# Patient Record
Sex: Female | Born: 1995 | Hispanic: No | Marital: Married | Smoking: Never smoker
Health system: Southern US, Community
[De-identification: ages and names within clinical notes are randomized; demographics above are authoritative.]

## PROBLEM LIST (undated history)

## (undated) DIAGNOSIS — A048 Other specified bacterial intestinal infections: Secondary | ICD-10-CM

## (undated) DIAGNOSIS — K219 Gastro-esophageal reflux disease without esophagitis: Secondary | ICD-10-CM

## (undated) HISTORY — DX: Other specified bacterial intestinal infections: A04.8

## (undated) HISTORY — DX: Gastro-esophageal reflux disease without esophagitis: K21.9

## (undated) HISTORY — PX: NO PAST SURGERIES: SHX2092

---

## 2016-02-20 ENCOUNTER — Encounter (HOSPITAL_COMMUNITY): Payer: Self-pay | Admitting: Emergency Medicine

## 2016-02-20 ENCOUNTER — Ambulatory Visit (HOSPITAL_COMMUNITY)
Admission: EM | Admit: 2016-02-20 | Discharge: 2016-02-20 | Disposition: A | Discharge status: Home / Self Care | Payer: PRIVATE HEALTH INSURANCE | Attending: Internal Medicine | Admitting: Internal Medicine

## 2016-02-20 DIAGNOSIS — R1013 Epigastric pain: Secondary | ICD-10-CM | POA: Diagnosis not present

## 2016-02-20 DIAGNOSIS — R197 Diarrhea, unspecified: Secondary | ICD-10-CM

## 2016-02-20 DIAGNOSIS — R112 Nausea with vomiting, unspecified: Secondary | ICD-10-CM

## 2016-02-20 LAB — POCT PREGNANCY, URINE: Preg Test, Ur: NEGATIVE

## 2016-02-20 LAB — POCT URINALYSIS DIP (DEVICE)
GLUCOSE, UA: NEGATIVE mg/dL
LEUKOCYTES UA: NEGATIVE
Nitrite: NEGATIVE
Protein, ur: >=300 mg/dL — AB
UROBILINOGEN UA: 0.2 mg/dL (ref 0.0–1.0)
pH: 6 (ref 5.0–8.0)

## 2016-02-20 MED ORDER — ONDANSETRON HCL 4 MG PO TABS: 4.0000 mg | ORAL_TABLET | Freq: Four times a day (QID) | ORAL | 0 refills | Status: DC

## 2016-02-20 NOTE — ED Provider Notes (Signed)
CSN: [id number]     Arrival date & time 02/20/16  1322 History   First MD Initiated Contact with Patient 02/20/16 1418     Chief Complaint  Patient presents with  . Emesis  . Diarrhea   (Consider location/radiation/quality/duration/timing/severity/associated sxs/prior Treatment) HPI [facility] Margit Bandalnajrani is a 20 y.o. female presenting to UC with c/o n/v/d and epigastric pain that started yesterday.  She reports 2-3 episodes of vomiting since yesterday and 3-4 episodes of watery diarrhea w/o blood or mucous in stool.  She did have some OTC pain reliever for her menstrual cramps, LMP 02/17/16, but nothing for her nausea or diarrhea.  Denies fever, chills, or urinary symptoms. No sick contacts or recent travel. Pt requesting school note as she missed yesterday and today due to feeling so bad. Denies hx of abdominal surgeries. She does take Nexium occasionally for acid reflux but has not taken today.    History reviewed. No pertinent past medical history. History reviewed. No pertinent surgical history. No family history on file. Social History  Substance Use Topics  . Smoking status: Never Smoker  . Smokeless tobacco: Never Used  . Alcohol use No   OB History    No data available     Review of Systems  Constitutional: Positive for fatigue. Negative for chills and fever.  Gastrointestinal: Positive for abdominal pain ( epigastric), diarrhea, nausea and vomiting. Negative for blood in stool and constipation.  Genitourinary: Negative for dysuria, flank pain, frequency and hematuria.  Musculoskeletal: Negative for back pain and myalgias.  Skin: Negative for color change and rash.  Neurological: Negative for syncope, weakness and numbness.    Allergies  Review of patient's allergies indicates no known allergies.  Home Medications   Prior to Admission medications   Medication Sig Start Date End Date Taking? Authorizing Provider  esomeprazole (NEXIUM) 20 MG capsule Take 20 mg  by mouth daily at 12 noon.   Yes Historical Provider, MD  ondansetron (ZOFRAN) 4 MG tablet Take 1 tablet (4 mg total) by mouth every 6 (six) hours. 02/20/16   Junius FinnerErin O'Malley, PA-C   Meds Ordered and Administered this Visit  Medications - No data to display  BP 137/80 (BP Location: Left Arm)   Pulse 104   Temp 98.3 F (36.8 C) (Oral)   Resp 18   LMP 02/17/2016   SpO2 96%  No data found.   Physical Exam  Constitutional: She appears well-developed and well-nourished. No distress.  Pt sitting on exam bed, NAD.  HENT:  Head: Normocephalic and atraumatic.  Mouth/Throat: Oropharynx is clear and moist.  Eyes: Conjunctivae are normal. No scleral icterus.  Neck: Normal range of motion. Neck supple.  Cardiovascular: Normal rate, regular rhythm and normal heart sounds.   Pulmonary/Chest: Effort normal and breath sounds normal. No respiratory distress. She has no wheezes. She has no rales.  Abdominal: Soft. She exhibits no distension and no mass. Bowel sounds are increased. There is tenderness in the epigastric area. There is no rebound and no guarding.  Musculoskeletal: Normal range of motion.  Neurological: She is alert.  Skin: Skin is warm and dry. She is not diaphoretic.  Nursing note and vitals reviewed.   Urgent Care Course   Clinical Course    Procedures (including critical care time)  Labs Review Labs Reviewed  POCT URINALYSIS DIP (DEVICE) - Abnormal; Notable for the following:       Result Value   Bilirubin Urine SMALL (*)    Ketones, ur TRACE (*)  Hgb urine dipstick LARGE (*)    Protein, ur >=300 (*)    All other components within normal limits  POCT PREGNANCY, URINE    Imaging Review No results found.   MDM   1. Nausea vomiting and diarrhea   2. Epigastric pain    Pt c/o epigastric pain with associated n/v/d. Pt appears well, non-toxic. NAD. Moist mucous membranes. Vitals: WNL  Epigastric tenderness w/o rebound or guarding. No abdominal distension. UA:  no evidence of UTI, hematuria likely from pt being on menstrual cycle. Pt denied urinary symptoms.  GI symptoms likely viral in nature. Rx: Zofran Encouraged fluids, rest, slowly introduce B.R.A.T diet and then normal diet when feeling better. F/u with PCP or return to UC in 2-3 days if not improving. Patient verbalized understanding and agreement with treatment plan.     Junius Finner, PA-C 02/20/16 1444

## 2016-02-20 NOTE — ED Triage Notes (Signed)
C/o vomiting and diarrhea onset yest associated w/nausea and epigastric pain  Hx of colon problems and states she takes Nexium for it.   LMP = 10/24  LBM = yest (normal)  Denies fevers, urinary sx  A&O x4... NAD

## 2016-03-26 ENCOUNTER — Encounter (INDEPENDENT_AMBULATORY_CARE_PROVIDER_SITE_OTHER): Payer: Self-pay

## 2016-03-26 ENCOUNTER — Ambulatory Visit (INDEPENDENT_AMBULATORY_CARE_PROVIDER_SITE_OTHER): Payer: PPO | Admitting: Internal Medicine

## 2016-03-26 ENCOUNTER — Encounter: Payer: Self-pay | Admitting: Internal Medicine

## 2016-03-26 VITALS — BP 125/71 | HR 90 | Temp 97.8°F | Ht 62.0 in | Wt 104.6 lb

## 2016-03-26 DIAGNOSIS — Z23 Encounter for immunization: Secondary | ICD-10-CM | POA: Diagnosis not present

## 2016-03-26 DIAGNOSIS — Z681 Body mass index (BMI) 19 or less, adult: Secondary | ICD-10-CM

## 2016-03-26 DIAGNOSIS — R1013 Epigastric pain: Secondary | ICD-10-CM | POA: Insufficient documentation

## 2016-03-26 DIAGNOSIS — K219 Gastro-esophageal reflux disease without esophagitis: Secondary | ICD-10-CM

## 2016-03-26 MED ORDER — PANTOPRAZOLE SODIUM 40 MG PO TBEC: 40.0000 mg | DELAYED_RELEASE_TABLET | Freq: Two times a day (BID) | ORAL | 1 refills | Status: DC

## 2016-03-26 NOTE — Patient Instructions (Addendum)
Please take Protonix 40 mg twice daily for one month. Also try and avoid foods and drinks that cause reflux.  We have provided a H. Pylori sample tube that you should collect and send to our lab for analysis.   Please return in 3-4 weeks to check on your symptoms - if they are no better at that time we will refer you to see the gastroenterology doctors.

## 2016-03-26 NOTE — Progress Notes (Addendum)
   CC: Reflux symptoms and abdominal pain  HPI:  Cassandra Stone is a [place]-American 20 y.o. female with PMHx detailed below presenting with progressively frequent episodes of post-prandial epigastric pain and reflux symptoms.   See problem based assessment and plan below for additional details.  Past Medical History:  Diagnosis Date  . GERD (gastroesophageal reflux disease)   . H. pylori infection    2014, treated   Social History: Nonsmoker, does not drink, no drug use. 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  Family history: Noncontributory  Review of Systems: Review of Systems  Constitutional: Negative for chills, fever, malaise/fatigue and weight loss.  Respiratory: Positive for shortness of breath (with her reflux when supine). Negative for cough, sputum production and wheezing.   Cardiovascular: Negative for chest pain and palpitations.  Gastrointestinal: Positive for abdominal pain and heartburn. Negative for blood in stool, constipation, diarrhea, melena, nausea and vomiting.  Psychiatric/Behavioral: Negative for depression and substance abuse. The patient is not nervous/anxious.   All other systems reviewed and are negative.   Physical Exam: Vitals:   03/26/16 1543  BP: 125/71  Pulse: 90  Temp: 97.8 F (36.6 C)  TempSrc: Oral  SpO2: 100%  Weight: 104 lb 9.6 oz (47.4 kg)  Height: 5\' 2"  (1.575 m)   Body mass index is 19.13 kg/m. GENERAL- Thin woman sitting comfortably in exam room chair, alert, in no distress, conversational HEENT- Atraumatic, EOMI, moist mucous membranes CARDIAC- Regular rate and rhythm, no murmurs, rubs or gallops. RESP- Clear to ascultation bilaterally, no wheezing or crackles, normal work of breathing ABDOMEN- Soft, mild epigastric tenderness to palpation, nondistended EXTREMITIES- Normal bulk and range of motion, no edema, 2+ peripheral pulses SKIN- Warm, dry, intact PSYCH-  Appropriate affect, clear speech, thoughts linear and goal-directed  Assessment & Plan:   See encounters tab for problem based medical decision making.  Patient seen with Dr. Oswaldo DoneVincent

## 2016-03-26 NOTE — Assessment & Plan Note (Signed)
Reports history of reflux and gastritis for years, underwent evaluation by physicians when living in [place] in 2013. History of H. Pylori infection at that time, underwent treatment, and had a negative stool antigen test afterward. [place]. Her symptoms have returned over the past few weeks/months with intermittent epigastric pain after eating meals, along with vertical burning sensation deep in her chest with burping/tasting her food. She reports these symptoms are worse when supine and cause significant discomfort at night when she lays down to sleep, sometimes with sensation of epigastric pressure and subjective shortness of breath. This discomfort is more-persistently triggered by certain foods and drinks, such as coffee. She has tried taking esomeprazole (20mg ) PRN for these symptoms with diminishing effectiveness. She denies weight loss, early satiety, N/V, constipation, diarrhea, cough.  Plan: - Trial Protonix 40mg  BID scheduled for 1 month - Check H pylori stool antigen  - If symptoms persist despite maximum medical management will refer to GI

## 2016-03-26 NOTE — Assessment & Plan Note (Signed)
Received flu vaccine today. Assess full vaccination history at future visit.

## 2016-03-29 ENCOUNTER — Encounter: Payer: Self-pay | Admitting: Internal Medicine

## 2016-03-30 NOTE — Progress Notes (Signed)
Internal Medicine Clinic Attending  I saw and evaluated the patient.  I personally confirmed the key portions of the history and exam documented by Dr. Johnson and I reviewed pertinent patient test results.  The assessment, diagnosis, and plan were formulated together and I agree with the documentation in the resident's note.  

## 2016-03-31 ENCOUNTER — Encounter (INDEPENDENT_AMBULATORY_CARE_PROVIDER_SITE_OTHER): Payer: Self-pay

## 2016-03-31 ENCOUNTER — Other Ambulatory Visit: Payer: PRIVATE HEALTH INSURANCE

## 2016-04-02 LAB — H. PYLORI ANTIGEN, STOOL: H pylori Ag, Stl: NEGATIVE

## 2016-05-06 ENCOUNTER — Telehealth: Payer: Self-pay | Admitting: Internal Medicine

## 2016-05-06 NOTE — Telephone Encounter (Signed)
APT. REMINDER CALL, PHONE NOT IN SERVICE °

## 2016-05-07 ENCOUNTER — Encounter: Payer: PRIVATE HEALTH INSURANCE | Admitting: Internal Medicine

## 2016-06-02 ENCOUNTER — Other Ambulatory Visit: Payer: Self-pay | Admitting: Internal Medicine

## 2016-06-02 DIAGNOSIS — K219 Gastro-esophageal reflux disease without esophagitis: Secondary | ICD-10-CM

## 2016-06-04 ENCOUNTER — Encounter (INDEPENDENT_AMBULATORY_CARE_PROVIDER_SITE_OTHER): Payer: Self-pay

## 2016-06-04 ENCOUNTER — Encounter: Payer: Self-pay | Admitting: Internal Medicine

## 2016-06-04 ENCOUNTER — Ambulatory Visit (INDEPENDENT_AMBULATORY_CARE_PROVIDER_SITE_OTHER): Payer: PPO | Admitting: Internal Medicine

## 2016-06-04 VITALS — BP 132/68 | HR 98 | Temp 98.0°F | Ht 62.0 in | Wt 113.5 lb

## 2016-06-04 DIAGNOSIS — Z8719 Personal history of other diseases of the digestive system: Secondary | ICD-10-CM | POA: Diagnosis not present

## 2016-06-04 DIAGNOSIS — N941 Unspecified dyspareunia: Secondary | ICD-10-CM | POA: Insufficient documentation

## 2016-06-04 DIAGNOSIS — K219 Gastro-esophageal reflux disease without esophagitis: Secondary | ICD-10-CM | POA: Diagnosis not present

## 2016-06-04 DIAGNOSIS — R1013 Epigastric pain: Secondary | ICD-10-CM

## 2016-06-04 LAB — POCT URINE PREGNANCY: Preg Test, Ur: NEGATIVE

## 2016-06-04 NOTE — Progress Notes (Signed)
   CC: Follow up of GERD and abdominal pain  HPI:  [name] is a 21 y.o. female with PMHx detailed below presenting for follow up of her GERD and persistent abdominal pain. She reports that the Protonix BID helped somewhat but she continues to experience intermittent epigastric discomfort and subjective fullness 3-4x daily. Belching seems to relieve this pain and subjective fullness. She continues to experience persistent reflux symptoms at night when supine, often with belching and taste of food or acid in her mouth. She denies orthopnea, dyspnea, or cough with these symptoms. She reports her symptoms are worse with certain foods (coffee, spicy foods, eggs, etc) and especially after large or late meals. She reports only being able to sleep 3-4 hours a night but attributes this to her demanding work and study schedule.  See problem based assessment and plan below for additional details.  Past Medical History:  Diagnosis Date  . GERD (gastroesophageal reflux disease)   . H. pylori infection    2014, treated    Review of Systems: Review of Systems  Constitutional: Negative for chills, fever and weight loss.  Respiratory: Negative for cough and shortness of breath.   Cardiovascular: Negative for chest pain, palpitations, orthopnea and claudication.  Gastrointestinal: Positive for abdominal pain and heartburn. Negative for blood in stool, constipation, diarrhea, melena, nausea and vomiting.  Genitourinary: Negative for dysuria.  Psychiatric/Behavioral: Negative for depression. The patient has insomnia. The patient is not nervous/anxious.   All other systems reviewed and are negative.    Physical Exam: Vitals:   06/04/16 1548 06/04/16 1630  BP: (!) 145/71 132/68  Pulse: 98   Temp: 98 F (36.7 C)   TempSrc: Oral   SpO2: 100%   Weight: 113 lb 8 oz (51.5 kg)   Height: 5\' 2"  (1.575 m)    Body mass index is 20.76 kg/m. GENERAL- Thin young woman in traditional  clothing sitting comfortably in exam room chair, alert, in no distress HEENT- Atraumatic, PERRL, moist mucous membranes CARDIAC- Regular rate and rhythm, no murmurs, rubs or gallops. RESP- Clear to ascultation bilaterally, no wheezing or crackles, normal work of breathing ABDOMEN- Normoactive bowel sounds, soft, epigastrum mildly tender to palpation, nondistended EXTREMITIES- Thin bulk and range of motion, no edema, 2+ peripheral pulses SKIN- Warm, dry, intact, without visible rash PSYCH- Appropriate affect, clear speech, thoughts linear and goal-directed  Assessment & Plan:   See encounters tab for problem based medical decision making.  Patient discussed with Dr. Rogelia BogaButcher

## 2016-06-04 NOTE — Patient Instructions (Addendum)
Please continue to take the Protonix medication twice daily.   We have placed an order for a gastric emptying study to assess your stomach, this should be scheduled within the next week.  Please keep a diary of foods/drinks and symptoms you experience after meals to find patterns and food you need to avoid.  We will see you in 2 months to reassess your symptoms. If you feel that they are worse before then you can call and move your appointment sooner.

## 2016-06-06 NOTE — Assessment & Plan Note (Signed)
Continues to experience persistent symptoms despite Protonix 40 mg BID for the past 2 months, although this medication has helped some. H. Pylori was negative. Episodes of epigastric pain and left sided fullness relieve by belching occur 3-4x daily. Persistent positional GERD symptoms at night, classic food triggers. Reports certain foods like yogurt seem to help. Symptoms causing moderate distress and impairing her ability to sleep as well.   Plan: - Continue Protonix 40 mg BID - Obtain NM gastric emptying study - Asked to create a food diary to find trigger foods/drinks or symptom patterns - Provided information on GERD and IBS-friendly diets and common trigger foods - If no improvement in 2 months may require referral to GI

## 2016-06-06 NOTE — Assessment & Plan Note (Addendum)
Requesting referral to gynecologist. Is concerned about dyspareunia. She initially states that this only happens at certain times of the month and that otherwise she has no pain with intercourse but later states it happens most of the time. She denies vaginal discharge, bleeding, or pelvic pain. She is sexually active and 1 female partner/husband and does not use OCPs. She is due for her pap smear. Likely endometriosis but warrants further investigation.  Plan: - ObtainPOC urine pregnancy test as we are obtaining NM gastric study in near future - negative - Provided gynecology referral

## 2016-06-11 NOTE — Progress Notes (Signed)
Internal Medicine Clinic Attending  Case discussed with Dr. Johnson at the time of the visit.  We reviewed the resident's history and exam and pertinent patient test results.  I agree with the assessment, diagnosis, and plan of care documented in the resident's note.  

## 2016-06-16 ENCOUNTER — Encounter (HOSPITAL_COMMUNITY)
Admission: RE | Admit: 2016-06-16 | Discharge: 2016-06-16 | Disposition: A | Discharge status: Home / Self Care | Payer: 59 | Source: Ambulatory Visit | Attending: Internal Medicine | Admitting: Internal Medicine

## 2016-06-16 DIAGNOSIS — K219 Gastro-esophageal reflux disease without esophagitis: Secondary | ICD-10-CM | POA: Diagnosis not present

## 2016-06-16 DIAGNOSIS — R1013 Epigastric pain: Secondary | ICD-10-CM | POA: Insufficient documentation

## 2016-06-16 MED ORDER — TECHNETIUM TC 99M SULFUR COLLOID
2.0000 | Freq: Once | INTRAVENOUS | Status: AC | PRN
  Administered 2016-06-16: 2 via INTRAVENOUS

## 2016-07-08 ENCOUNTER — Encounter: Payer: Self-pay | Admitting: Internal Medicine

## 2016-07-14 ENCOUNTER — Encounter: Payer: Self-pay | Admitting: Obstetrics & Gynecology

## 2016-07-21 ENCOUNTER — Other Ambulatory Visit: Payer: Self-pay | Admitting: Internal Medicine

## 2016-07-21 DIAGNOSIS — K219 Gastro-esophageal reflux disease without esophagitis: Secondary | ICD-10-CM

## 2016-08-09 ENCOUNTER — Encounter: Payer: 59 | Admitting: Obstetrics & Gynecology

## 2016-08-13 ENCOUNTER — Encounter: Payer: PRIVATE HEALTH INSURANCE | Admitting: Internal Medicine

## 2016-08-18 ENCOUNTER — Other Ambulatory Visit: Payer: Self-pay | Admitting: Internal Medicine

## 2016-08-18 DIAGNOSIS — K219 Gastro-esophageal reflux disease without esophagitis: Secondary | ICD-10-CM

## 2016-09-10 NOTE — Addendum Note (Signed)
Addended by: Neomia DearPOWERS, Tascha Casares E on: 09/10/2016 06:37 AM   Modules accepted: Orders

## 2016-10-04 ENCOUNTER — Encounter: Payer: Self-pay | Admitting: *Deleted

## 2017-06-10 ENCOUNTER — Encounter: Payer: Self-pay | Admitting: Internal Medicine

## 2017-08-14 ENCOUNTER — Inpatient Hospital Stay (HOSPITAL_COMMUNITY)
Admission: AD | Admit: 2017-08-14 | Discharge: 2017-08-14 | Disposition: A | Discharge status: Home / Self Care | Payer: PPO | Source: Ambulatory Visit | Attending: Obstetrics and Gynecology | Admitting: Obstetrics and Gynecology

## 2017-08-14 DIAGNOSIS — N939 Abnormal uterine and vaginal bleeding, unspecified: Secondary | ICD-10-CM | POA: Insufficient documentation

## 2017-08-14 DIAGNOSIS — O039 Complete or unspecified spontaneous abortion without complication: Secondary | ICD-10-CM

## 2017-08-14 DIAGNOSIS — O0281 Inappropriate change in quantitative human chorionic gonadotropin (hCG) in early pregnancy: Secondary | ICD-10-CM

## 2017-08-14 LAB — URINALYSIS, ROUTINE W REFLEX MICROSCOPIC
BACTERIA UA: NONE SEEN
BILIRUBIN URINE: NEGATIVE
Glucose, UA: NEGATIVE mg/dL
KETONES UR: 5 mg/dL — AB
LEUKOCYTES UA: NEGATIVE
Nitrite: NEGATIVE
PROTEIN: NEGATIVE mg/dL
Specific Gravity, Urine: 1.017 (ref 1.005–1.030)
pH: 5 (ref 5.0–8.0)

## 2017-08-14 LAB — ABO/RH: ABO/RH(D): O POS

## 2017-08-14 LAB — HCG, QUANTITATIVE, PREGNANCY: HCG, BETA CHAIN, QUANT, S: 3 m[IU]/mL (ref <5)

## 2017-08-14 LAB — POCT PREGNANCY, URINE: Preg Test, Ur: NEGATIVE

## 2017-08-14 NOTE — MAU Note (Signed)
History     Chief Complaint  Patient presents with  . Vaginal Bleeding   22 yo G1P0 married female presents with c/o vaginal bleeding that started today. Pt reports having had a urine preg test that was neg and blood work that was positive couple days ago. Records reviewed and pt was seen at [facility] on 4/17 where a complete exam was done and UPT was neg and HCG was 24. Pt was not bleeding at the time and her cycle is late  OB History   None     Past Medical History:  Diagnosis Date  . GERD (gastroesophageal reflux disease)   . H. pylori infection    2014, treated    No past surgical history on file.  No family history on file.  Social History   Tobacco Use  . Smoking status: Never Smoker  . Smokeless tobacco: Never Used  Substance Use Topics  . Alcohol use: No  . Drug use: No    Allergies: No Known Allergies  Medications Prior to Admission  Medication Sig Dispense Refill Last Dose  . pantoprazole (PROTONIX) 40 MG tablet TAKE 1 TABLET BY MOUTH TWICE A DAY 60 tablet 0      Physical Exam   Blood pressure 136/83, pulse (!) 116, temperature 98.7 F (37.1 C), temperature source Oral, resp. rate 18, height 5\' 2"  (1.575 m), weight 57.5 kg (126 lb 12 oz).  No exam performed today, not indicated. ED Course  IMP: vaginal bleeding most likely chemical pregnancy P)  Urine preg test was neg. repeat HCG. Check blood type MDM  addendum " hcg=3 c/w chemical pregnancy. Advised couple had they not done original blood work then she would have assumed that this is her cycle. Therefore this is considered a chemical pregnancy. Please f/u at office with Dr Ernestina PennaFogleman Serita KyleSheronette A Tarae Wooden, MD 6:23 PM 08/14/2017

## 2017-08-14 NOTE — MAU Note (Signed)
Bleeding since this morning. Some brown mucus discharge before that and 1 small clot.  [facility] and quant of 24.

## 2017-12-05 IMAGING — NM NM GASTRIC EMPTYING
4 series · 4 of 4 positions shown · non-contrast
Comparison: None.

CLINICAL DATA: Postprandial pain and reflux

EXAM:
NUCLEAR MEDICINE GASTRIC EMPTYING SCAN
TECHNIQUE: After oral ingestion of radiolabeled meal, sequential abdominal
images were obtained for 4 hours. Percentage of activity emptying
the stomach was calculated at 1 hour, 2 hour, 3 hour, and 4 hours.
RADIOPHARMACEUTICALS:  2.0 mCi Uc-NNm sulfur colloid in standardized
meal

[ge gastric empty · 2.51mm/px · 1 of 1 slices shown (1 of 4)]
[im 1/1]
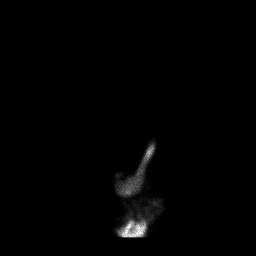

[ge gastric empty · 2.51mm/px · 1 of 1 slices shown (2 of 4)]
[im 1/1]
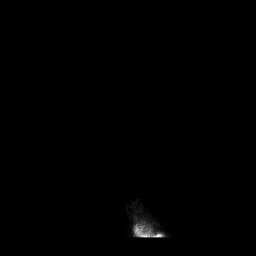

[ge gastric empty · 2.51mm/px · 1 of 1 slices shown (3 of 4)]
[im 1/1]
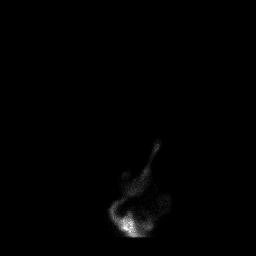

[ge gastric empty · 2.51mm/px · 1 of 1 slices shown (4 of 4)]
[im 1/1]
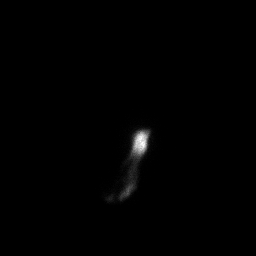

[4 of 4 positions shown; findings below may reference images not displayed]

FINDINGS: Expected location of the stomach in the left upper quadrant.
Ingested meal empties the stomach gradually over the course of the
study.

Sixty emptied at 1 hr ( normal >= 10%)

Eighty-two emptied at 2 hr ( normal >= 40%)

Ninety-seven emptied at 3 hr ( normal >= 70%)

The exam ended at 3 hours
IMPRESSION: Normal gastric emptying study.

## 2018-01-24 LAB — OB RESULTS CONSOLE HIV ANTIBODY (ROUTINE TESTING): HIV: NONREACTIVE

## 2018-01-24 LAB — OB RESULTS CONSOLE GC/CHLAMYDIA
Chlamydia: NEGATIVE
Gonorrhea: NEGATIVE

## 2018-01-24 LAB — OB RESULTS CONSOLE ABO/RH: RH Type: POSITIVE

## 2018-01-24 LAB — OB RESULTS CONSOLE RUBELLA ANTIBODY, IGM: Rubella: IMMUNE

## 2018-01-24 LAB — OB RESULTS CONSOLE RPR: RPR: NONREACTIVE

## 2018-01-24 LAB — OB RESULTS CONSOLE ANTIBODY SCREEN: Antibody Screen: NEGATIVE

## 2018-01-24 LAB — OB RESULTS CONSOLE HEPATITIS B SURFACE ANTIGEN: Hepatitis B Surface Ag: NEGATIVE

## 2018-02-11 LAB — OB RESULTS CONSOLE ABO/RH

## 2018-04-26 NOTE — L&D Delivery Note (Signed)
Operative Delivery Note Pushing for 2.1/2 hrs since 12.30 am. OP position. Tight introitus with vaginal bruising and bleeding. Thick perineal body and labial and perineal swelling. Station +3/4 with no further progress.  Vacuum assisted delivery recommended with episiotomy.  Verbal consent: obtained from patient.  Risks and benefits discussed in detail.  Risks include, but are not limited to the risks of anesthesia, bleeding, infection, damage to maternal tissues, fetal cephalhematoma.  There is also the risk of inability to effect vaginal delivery of the head, or shoulder dystocia that cannot be resolved by established maneuvers, leading to the need for emergency cesarean section.  At 3:06 AM a viable and healthy female was delivered via Vaginal, Vacuum Investment banker, operational).  Presentation: vertex; Position: Occiput,, Posterior; Station: +4. Kiwi used. Over one contraction pt pushed 4 times and delivery was achieved.   APGAR: 8, 10; weight  pending.   Placenta status: spontaneous and complete.   Cord:  with the following complications: None.  Cord pH: N/A  Anesthesia: Epidural and local 1% plain lidocaine Instruments: Kiwi Episiotomy: Left Mediolateral Lacerations: 2nd degree (episiotomy without extension) and bilateral vaginal sulcus tears  Suture Repair: 3.0 vicryl rapide Est. Blood Loss (mL):  400 cc  Mom to postpartum.  Baby to Couplet care / Skin to Skin.  Robley Fries 08/14/2018, 3:42 AM

## 2018-06-20 ENCOUNTER — Encounter: Payer: Self-pay | Admitting: Internal Medicine

## 2018-07-12 LAB — OB RESULTS CONSOLE GBS: GBS: NEGATIVE

## 2018-07-21 ENCOUNTER — Ambulatory Visit (HOSPITAL_COMMUNITY)
Admission: EM | Admit: 2018-07-21 | Discharge: 2018-07-21 | Disposition: A | Discharge status: Home / Self Care | Payer: PPO

## 2018-07-21 ENCOUNTER — Other Ambulatory Visit: Payer: Self-pay

## 2018-07-21 ENCOUNTER — Encounter (HOSPITAL_COMMUNITY): Payer: Self-pay | Admitting: Emergency Medicine

## 2018-07-21 DIAGNOSIS — S90851A Superficial foreign body, right foot, initial encounter: Secondary | ICD-10-CM

## 2018-07-21 NOTE — ED Provider Notes (Signed)
[facility]   [id number] 07/21/18 Arrival Time: 1655  CC: FB in heel  SUBJECTIVE:  Cassandra Stone is a 23 y.o. female pt currently pregnant, who presents with a FB in bottom of right foot.  Symptoms began after she stepped on a object earlier today.  Localizes the the pain to the RT heel of foot.  Describes it as painful and red.  Has tried squeezing it out without relief.  Symptoms are made worse with walking.  Denies fever, chills, erythema, swelling, discharge, SOB, chest pain, abdominal pain, changes in bowel or bladder function.    Up-to-date on tetanus  ROS: As per HPI.  Past Medical History:  Diagnosis Date  . GERD (gastroesophageal reflux disease)   . H. pylori infection    2014, treated   No past surgical history on file. No Known Allergies No current facility-administered medications on file prior to encounter.    Current Outpatient Medications on File Prior to Encounter  Medication Sig Dispense Refill  . pantoprazole (PROTONIX) 40 MG tablet TAKE 1 TABLET BY MOUTH TWICE A DAY 60 tablet 0   Social History   Socioeconomic History  . Marital status: Married    Spouse name: Not on file  . Number of children: Not on file  . Years of education: Not on file  . Highest education level: Not on file  Occupational History  . Not on file  Social Needs  . Financial resource strain: Not on file  . Food insecurity:    Worry: Not on file    Inability: Not on file  . Transportation needs:    Medical: Not on file    Non-medical: Not on file  Tobacco Use  . Smoking status: Never Smoker  . Smokeless tobacco: Never Used  Substance and Sexual Activity  . Alcohol use: No  . Drug use: No  . Sexual activity: Yes    Partners: Male    Birth control/protection: None  Lifestyle  . Physical activity:    Days per week: Not on file    Minutes per session: Not on file  . Stress: Not on file  Relationships  . Social connections:    Talks on phone: Not on  file    Gets together: Not on file    Attends religious service: Not on file    Active member of club or organization: Not on file    Attends meetings of clubs or organizations: Not on file    Relationship status: Not on file  . Intimate partner violence:    Fear of current or ex partner: Not on file    Emotionally abused: Not on file    Physically abused: Not on file    Forced sexual activity: Not on file  Other Topics Concern  . Not on file  Social History Narrative  . Not on file   No family history on file.  OBJECTIVE: Vitals:   07/21/18 1714  BP: (!) 142/90  Pulse: (!) 101  Resp: 16  Temp: 98.1 F (36.7 C)  TempSrc: Oral  SpO2: 100%    General appearance: alert; no distress Head: NCAT Lungs: normal respiratory effort Extremities: no edema Skin: warm and dry; black pinpoint object to RT heel, TTP without obvious drainage or bleeding  Psychological: alert and cooperative; normal mood and affect  PROCEDURE:  Verbal consent obtained. Area over RT heel cleansed with alchol swab.  #11 blade scalpel and 18 gauge needle were used to deroof skin over FB  on RT heel.  Unable to remove FB due to patient discomfort. Minimal bleeding. No complications.  ASSESSMENT & PLAN:  1. Foreign body in heel, right, initial encounter     Unable to remove foreign body from heel Soak foot in warm water 3-4 times daily Wash site daily with warm water and mild soap Keep covered to avoid friction Follow up here or with PCP if symptoms persists Call or go to the ED if you have any new or worsening symptoms fever, chills, nausea, vomiting, increased redness, swelling, etc..  Reviewed expectations re: course of current medical issues. Questions answered. Outlined signs and symptoms indicating need for more acute intervention. Patient verbalized understanding. After Visit Summary given.   Rennis Harding, PA-C 07/21/18 1906

## 2018-07-21 NOTE — Discharge Instructions (Signed)
Unable to remove foreign body from heel Soak foot in warm water 3-4 times daily Wash site daily with warm water and mild soap Keep covered to avoid friction Follow up here or with PCP if symptoms persists Call or go to the ED if you have any new or worsening symptoms fever, chills, nausea, vomiting, increased redness, swelling, etc..

## 2018-07-21 NOTE — ED Triage Notes (Signed)
Per pt she is having foot pain due to a small object in the heel of her right foot.

## 2018-08-13 ENCOUNTER — Inpatient Hospital Stay (HOSPITAL_COMMUNITY)
Admission: AD | Admit: 2018-08-13 | Discharge: 2018-08-16 | DRG: 806 | Disposition: A | Discharge status: Home / Self Care | Payer: PPO | Source: Intra-hospital | Attending: Obstetrics & Gynecology | Admitting: Obstetrics & Gynecology

## 2018-08-13 ENCOUNTER — Other Ambulatory Visit: Payer: Self-pay

## 2018-08-13 ENCOUNTER — Inpatient Hospital Stay (HOSPITAL_COMMUNITY): Payer: PPO | Admitting: Anesthesiology

## 2018-08-13 ENCOUNTER — Encounter (HOSPITAL_COMMUNITY): Payer: Self-pay | Admitting: *Deleted

## 2018-08-13 DIAGNOSIS — O9081 Anemia of the puerperium: Secondary | ICD-10-CM | POA: Diagnosis not present

## 2018-08-13 DIAGNOSIS — S91341D Puncture wound with foreign body, right foot, subsequent encounter: Secondary | ICD-10-CM

## 2018-08-13 DIAGNOSIS — Z3A40 40 weeks gestation of pregnancy: Secondary | ICD-10-CM | POA: Diagnosis not present

## 2018-08-13 DIAGNOSIS — M795 Residual foreign body in soft tissue: Secondary | ICD-10-CM

## 2018-08-13 DIAGNOSIS — R030 Elevated blood-pressure reading, without diagnosis of hypertension: Secondary | ICD-10-CM | POA: Diagnosis present

## 2018-08-13 DIAGNOSIS — S99921S Unspecified injury of right foot, sequela: Secondary | ICD-10-CM

## 2018-08-13 DIAGNOSIS — L923 Foreign body granuloma of the skin and subcutaneous tissue: Secondary | ICD-10-CM | POA: Diagnosis not present

## 2018-08-13 DIAGNOSIS — O26893 Other specified pregnancy related conditions, third trimester: Principal | ICD-10-CM | POA: Diagnosis present

## 2018-08-13 DIAGNOSIS — Z3490 Encounter for supervision of normal pregnancy, unspecified, unspecified trimester: Secondary | ICD-10-CM

## 2018-08-13 DIAGNOSIS — O9989 Other specified diseases and conditions complicating pregnancy, childbirth and the puerperium: Secondary | ICD-10-CM | POA: Diagnosis present

## 2018-08-13 DIAGNOSIS — D62 Acute posthemorrhagic anemia: Secondary | ICD-10-CM | POA: Diagnosis not present

## 2018-08-13 DIAGNOSIS — O9902 Anemia complicating childbirth: Secondary | ICD-10-CM | POA: Diagnosis present

## 2018-08-13 LAB — COMPREHENSIVE METABOLIC PANEL
ALT: 13 U/L (ref 0–44)
AST: 23 U/L (ref 15–41)
Albumin: 3.1 g/dL — ABNORMAL LOW (ref 3.5–5.0)
Alkaline Phosphatase: 178 U/L — ABNORMAL HIGH (ref 38–126)
Anion gap: 11 (ref 5–15)
BUN: 7 mg/dL (ref 6–20)
CO2: 20 mmol/L — ABNORMAL LOW (ref 22–32)
Calcium: 9 mg/dL (ref 8.9–10.3)
Chloride: 105 mmol/L (ref 98–111)
Creatinine, Ser: 0.49 mg/dL (ref 0.44–1.00)
GFR calc Af Amer: >60 mL/min (ref >60)
GFR calc non Af Amer: >60 mL/min (ref >60)
Glucose, Bld: 85 mg/dL (ref 70–99)
Potassium: 4.1 mmol/L (ref 3.5–5.1)
Sodium: 136 mmol/L (ref 135–145)
Total Bilirubin: 0.6 mg/dL (ref 0.3–1.2)
Total Protein: 6.4 g/dL — ABNORMAL LOW (ref 6.5–8.1)

## 2018-08-13 LAB — URIC ACID: Uric Acid, Serum: 4.1 mg/dL (ref 2.5–7.1)

## 2018-08-13 LAB — CBC
HCT: 36.5 % (ref 36.0–46.0)
Hemoglobin: 11.4 g/dL — ABNORMAL LOW (ref 12.0–15.0)
MCH: 24.5 pg — ABNORMAL LOW (ref 26.0–34.0)
MCHC: 31.2 g/dL (ref 30.0–36.0)
MCV: 78.5 fL — ABNORMAL LOW (ref 80.0–100.0)
Platelets: 318 10*3/uL (ref 150–400)
RBC: 4.65 MIL/uL (ref 3.87–5.11)
RDW: 14.9 % (ref 11.5–15.5)
WBC: 10.8 10*3/uL — ABNORMAL HIGH (ref 4.0–10.5)
nRBC: 0 % (ref 0.0–0.2)

## 2018-08-13 LAB — PROTEIN / CREATININE RATIO, URINE
Creatinine, Urine: 115.65 mg/dL
Protein Creatinine Ratio: 0.09 mg/mg{Cre} (ref 0.00–0.15)
Total Protein, Urine: 10 mg/dL

## 2018-08-13 LAB — TYPE AND SCREEN
ABO/RH(D): O POS
Antibody Screen: NEGATIVE

## 2018-08-13 MED ORDER — SOD CITRATE-CITRIC ACID 500-334 MG/5ML PO SOLN: 30.0000 mL | ORAL | Status: DC | PRN

## 2018-08-13 MED ORDER — LACTATED RINGERS IV BOLUS
200.0000 mL | Freq: Once | INTRAVENOUS | Status: AC
  Administered 2018-08-13: 200 mL via INTRAVENOUS

## 2018-08-13 MED ORDER — MISOPROSTOL 50MCG HALF TABLET
50.0000 ug | ORAL_TABLET | ORAL | Status: AC
  Administered 2018-08-13: 16:00:00 50 ug via ORAL
  Filled 2018-08-13: qty 1

## 2018-08-13 MED ORDER — PHENYLEPHRINE 40 MCG/ML (10ML) SYRINGE FOR IV PUSH (FOR BLOOD PRESSURE SUPPORT): 80.0000 ug | PREFILLED_SYRINGE | INTRAVENOUS | Status: DC | PRN

## 2018-08-13 MED ORDER — EPHEDRINE 5 MG/ML INJ: 10.0000 mg | INTRAVENOUS | Status: DC | PRN

## 2018-08-13 MED ORDER — PHENYLEPHRINE 40 MCG/ML (10ML) SYRINGE FOR IV PUSH (FOR BLOOD PRESSURE SUPPORT)
80.0000 ug | PREFILLED_SYRINGE | INTRAVENOUS | Status: DC | PRN
  Filled 2018-08-13: qty 10

## 2018-08-13 MED ORDER — OXYCODONE-ACETAMINOPHEN 5-325 MG PO TABS: 2.0000 | ORAL_TABLET | ORAL | Status: DC | PRN

## 2018-08-13 MED ORDER — LACTATED RINGERS IV SOLN
INTRAVENOUS | Status: DC
  Administered 2018-08-13 (×2): via INTRAVENOUS

## 2018-08-13 MED ORDER — LIDOCAINE HCL (PF) 1 % IJ SOLN
30.0000 mL | INTRAMUSCULAR | Status: AC | PRN
  Administered 2018-08-14: 30 mL via SUBCUTANEOUS
  Filled 2018-08-13: qty 30

## 2018-08-13 MED ORDER — LACTATED RINGERS IV SOLN
500.0000 mL | Freq: Once | INTRAVENOUS | Status: AC
  Administered 2018-08-13: 20:00:00 500 mL via INTRAVENOUS

## 2018-08-13 MED ORDER — NALBUPHINE HCL 10 MG/ML IJ SOLN
10.0000 mg | Freq: Once | INTRAMUSCULAR | Status: AC
  Administered 2018-08-13: 10 mg via INTRAVENOUS
  Filled 2018-08-13: qty 1

## 2018-08-13 MED ORDER — OXYTOCIN 40 UNITS IN NORMAL SALINE INFUSION - SIMPLE MED
2.5000 [IU]/h | INTRAVENOUS | Status: DC
  Filled 2018-08-13: qty 1000

## 2018-08-13 MED ORDER — ACETAMINOPHEN 325 MG PO TABS: 650.0000 mg | ORAL_TABLET | ORAL | Status: DC | PRN

## 2018-08-13 MED ORDER — LIDOCAINE-EPINEPHRINE (PF) 2 %-1:200000 IJ SOLN
INTRAMUSCULAR | Status: DC | PRN
  Administered 2018-08-13: 2 mL via EPIDURAL
  Administered 2018-08-13: 3 mL via EPIDURAL

## 2018-08-13 MED ORDER — DIPHENHYDRAMINE HCL 50 MG/ML IJ SOLN: 12.5000 mg | INTRAMUSCULAR | Status: DC | PRN

## 2018-08-13 MED ORDER — OXYCODONE-ACETAMINOPHEN 5-325 MG PO TABS: 1.0000 | ORAL_TABLET | ORAL | Status: DC | PRN

## 2018-08-13 MED ORDER — ONDANSETRON HCL 4 MG/2ML IJ SOLN: 4.0000 mg | Freq: Four times a day (QID) | INTRAMUSCULAR | Status: DC | PRN

## 2018-08-13 MED ORDER — TERBUTALINE SULFATE 1 MG/ML IJ SOLN: 0.2500 mg | Freq: Once | INTRAMUSCULAR | Status: DC | PRN

## 2018-08-13 MED ORDER — LACTATED RINGERS IV SOLN: 500.0000 mL | INTRAVENOUS | Status: DC | PRN

## 2018-08-13 MED ORDER — FENTANYL-BUPIVACAINE-NACL 0.5-0.125-0.9 MG/250ML-% EP SOLN
12.0000 mL/h | EPIDURAL | Status: DC | PRN
  Filled 2018-08-13: qty 250

## 2018-08-13 MED ORDER — OXYTOCIN BOLUS FROM INFUSION
500.0000 mL | Freq: Once | INTRAVENOUS | Status: AC
  Administered 2018-08-14: 500 mL via INTRAVENOUS

## 2018-08-13 MED ORDER — SODIUM CHLORIDE (PF) 0.9 % IJ SOLN
INTRAMUSCULAR | Status: DC | PRN
  Administered 2018-08-13: 14 mL/h via EPIDURAL

## 2018-08-13 NOTE — Progress Notes (Addendum)
Cassandra Stone is a 23 y.o. G2P0010 at [redacted]w[redacted]d by ultrasound admitted for induction of labor due to Elective at term.  Subjective: Awaiting epidural  Objective: BP 139/87   Pulse 96   Temp 98.2 F (36.8 C) (Oral)   Resp 18   Ht 5\' 2"  (1.575 m)   Wt 67.6 kg   BMI 27.25 kg/m   FHR: 120 bpm, variability: moderate,  accelerations:  Present,  decelerations:  Absent UC:  regular, every 3-4 minutes per pt SVE:   Labs: Lab Results  Component Value Date   WBC 10.8 (H) 08/13/2018   HGB 11.4 (L) 08/13/2018   HCT 36.5 08/13/2018   MCV 78.5 (L) 08/13/2018   PLT 318 08/13/2018    Assessment / Plan: Induction of labor due to term,  progressing well on pitocin Elevated BP without preeclampsia  Labor: Progressing normally Fetal Wellbeing:  Category I Pain Control:  Epidural I/D:  n/a Anticipated MOD:  NSVD  Robley Fries 08/13/2018, 8:56 PM

## 2018-08-13 NOTE — Progress Notes (Signed)
Patient ID: [facility], female   DOB: May 09, 1995 23 y.o.   MRN: [id number]  Epidural working  BP 122/61 (BP Location: Right Arm)   Pulse 99   Temp 98.3 F (36.8 C) (Oral)   Resp 20   Ht 5\' 2"  (1.575 m)   Wt 67.6 kg   BMI 27.25 kg/m    FHT 120s - cat I Toco q 2 -3 min, spontaneous (s/p 1 cytotec 50 mcg PO at 4 pm) SVE - 6/ 100%/-2/ Vx. SROM during exam, clear AF Pelvis borderline.   Continue to assess progress, esp descent Right exaggerated Sims  Borderline pelvis d/w pt   V.Thiago Ragsdale MD

## 2018-08-13 NOTE — Anesthesia Procedure Notes (Signed)
Epidural Patient location during procedure: OB Start time: 08/13/2018 8:45 PM End time: 08/13/2018 9:00 PM  Staffing Anesthesiologist: Elmer Picker, MD Performed: anesthesiologist   Preanesthetic Checklist Completed: patient identified, pre-op evaluation, timeout performed, IV checked, risks and benefits discussed and monitors and equipment checked  Epidural Patient position: sitting Prep: site prepped and draped and DuraPrep Patient monitoring: continuous pulse ox, blood pressure, heart rate and cardiac monitor Approach: midline Location: L3-L4 Injection technique: LOR air  Needle:  Needle type: Tuohy  Needle gauge: 17 G Needle length: 9 cm Needle insertion depth: 4 cm Catheter type: closed end flexible Catheter size: 19 Gauge Catheter at skin depth: 10 cm Test dose: negative  Assessment Sensory level: T8 Events: blood not aspirated, injection not painful, no injection resistance, negative IV test and no paresthesia  Additional Notes Patient identified. Risks/Benefits/Options discussed with patient including but not limited to bleeding, infection, nerve damage, paralysis, failed block, incomplete pain control, headache, blood pressure changes, nausea, vomiting, reactions to medication both or allergic, itching and postpartum back pain. Confirmed with bedside nurse the patient's most recent platelet count. Confirmed with patient that they are not currently taking any anticoagulation, have any bleeding history or any family history of bleeding disorders. Patient expressed understanding and wished to proceed. All questions were answered. Sterile technique was used throughout the entire procedure. Please see nursing notes for vital signs. Test dose was given through epidural catheter and negative prior to continuing to dose epidural or start infusion. Warning signs of high block given to the patient including shortness of breath, tingling/numbness in hands, complete motor block,  or any concerning symptoms with instructions to call for help. Patient was given instructions on fall risk and not to get out of bed. All questions and concerns addressed with instructions to call with any issues or inadequate analgesia.  Reason for block:procedure for pain

## 2018-08-13 NOTE — Anesthesia Preprocedure Evaluation (Signed)
Anesthesia Evaluation  Patient identified by MRN, date of birth, ID band Patient awake    Reviewed: Allergy & Precautions, NPO status , Patient's Chart, lab work & pertinent test results  Airway Mallampati: II  TM Distance: >3 FB Neck ROM: Full    Dental no notable dental hx.    Pulmonary neg pulmonary ROS,    Pulmonary exam normal breath sounds clear to auscultation       Cardiovascular negative cardio ROS Normal cardiovascular exam Rhythm:Regular Rate:Normal     Neuro/Psych negative neurological ROS  negative psych ROS   GI/Hepatic Neg liver ROS, GERD  ,  Endo/Other  negative endocrine ROS  Renal/GU negative Renal ROS  negative genitourinary   Musculoskeletal negative musculoskeletal ROS (+)   Abdominal   Peds negative pediatric ROS (+)  Hematology negative hematology ROS (+)   Anesthesia Other Findings   Reproductive/Obstetrics (+) Pregnancy                             Anesthesia Physical Anesthesia Plan  ASA: II  Anesthesia Plan: Epidural   Post-op Pain Management:    Induction:   PONV Risk Score and Plan: Treatment may vary due to age or medical condition  Airway Management Planned: Natural Airway  Additional Equipment:   Intra-op Plan:   Post-operative Plan:   Informed Consent: I have reviewed the patients History and Physical, chart, labs and discussed the procedure including the risks, benefits and alternatives for the proposed anesthesia with the patient or authorized representative who has indicated his/her understanding and acceptance.       Plan Discussed with: Anesthesiologist  Anesthesia Plan Comments: (Patient identified. Risks, benefits, options discussed with patient including but not limited to bleeding, infection, nerve damage, paralysis, failed block, incomplete pain control, headache, blood pressure changes, nausea, vomiting, reactions to  medication, itching, and post partum back pain. Confirmed with bedside nurse the patient's most recent platelet count. Confirmed with the patient that they are not taking any anticoagulation, have any bleeding history or any family history of bleeding disorders. Patient expressed understanding and wishes to proceed. All questions were answered. )        Anesthesia Quick Evaluation

## 2018-08-13 NOTE — Progress Notes (Signed)
Faculty Practice OB/GYN Attending Note  I was called to confirm fetal presentation by ultrasound.  I met patient, ultrasound was used to confirm cephalic presentation. Will continue plan as per Dr. Benjie Karvonen.   Verita Schneiders, MD, [place] for [employer], [place]

## 2018-08-13 NOTE — H&P (Addendum)
Cassandra Stone is a 23 y.o. female G2P0. presenting for IOL at 40.5 wks EDC 08/08/18 by 9 wk sono.  Healthy female, uncomplicated pregnancy, PNCare from 8 wks. Patient does not tolerate vaginal exam AT ALL.  All labs nl, QUAD neg. GBS neg. Anatomy nl. 32 wks growth AGA 3'12" at 45% and AC 22%, AFI nl. Vx.   OB History    Gravida  2   Para  0   Term      Preterm      AB  1   Living        SAB  1   TAB      Ectopic      Multiple      Live Births             Past Medical History:  Diagnosis Date  . GERD (gastroesophageal reflux disease)   . H. pylori infection    2014, treated   Past Surgical History:  Procedure Laterality Date  . NO PAST SURGERIES     Family History: family history is not on file. Social History:  reports that she has never smoked. She has never used smokeless tobacco. She reports that she does not drink alcohol or use drugs.     Maternal Diabetes: No Genetic Screening: Normal Maternal Ultrasounds/Referrals: Normal Fetal Ultrasounds or other Referrals:  None Maternal Substance Abuse:  No Significant Maternal Medications:  None Significant Maternal Lab Results:  Lab values include: Group B Strep negative Other Comments:  None  ROS neg  History   Height 5\' 2"  (1.575 m), weight 65.8 kg. Exam Physical Exam  BP (!) 146/99   Pulse 100   Temp 98.3 F (36.8 C) (Oral)   Resp 16   Ht 5\' 2"  (1.575 m)   Wt 67.6 kg   BMI 27.25 kg/m  Physical exam:  A&O x 3, no acute distress. Pleasant HEENT neg, no thyromegaly Lungs CTA bilat CV RRR, S1S2 normal Abdo soft, non tender, non acute Extr no edema/ tenderness Pelvic deferred as pt not allowing exam FHT  150s + accels no decels mod variability - cat I Toco irreg   Prenatal labs: ABO, Rh: --/--/O POS Performed at [facility], [address] [place], [place] [address]  (04/21 1619) Antibody:  neg Rubella:  Immune RPR:   Neg HBsAg:   Neg HIV:   Neg GBS:    Neg GLucola nl QUAD neg   Assessment/Plan: 23 yo G2P0 at 40.5 wks. Here for IOL for elective at term. DIFFICULT pelvic exam Elevated BP - PIH labs and urine P/c ratio now.  Cytotec PO 50 mcg, repeat in 4 hrs as needed, followed by pitocin as needed.  Early epidural with patient request FHT cat I EFW 7-7.1/2 lbs  Working towards vaginal delivery.  Patient hesitant about IOL, wants to wait till 42 weeks, reviewed newer guidelines for delivery by 41 wks due to increased fetal morbidity. Agrees.   Robley Fries 08/13/2018, 3:34 PM

## 2018-08-14 ENCOUNTER — Encounter (HOSPITAL_COMMUNITY): Payer: Self-pay

## 2018-08-14 LAB — CBC
HCT: 28.3 % — ABNORMAL LOW (ref 36.0–46.0)
Hemoglobin: 9.1 g/dL — ABNORMAL LOW (ref 12.0–15.0)
MCH: 24.9 pg — ABNORMAL LOW (ref 26.0–34.0)
MCHC: 32.2 g/dL (ref 30.0–36.0)
MCV: 77.3 fL — ABNORMAL LOW (ref 80.0–100.0)
Platelets: 259 10*3/uL (ref 150–400)
RBC: 3.66 MIL/uL — ABNORMAL LOW (ref 3.87–5.11)
RDW: 14.6 % (ref 11.5–15.5)
WBC: 14.6 10*3/uL — ABNORMAL HIGH (ref 4.0–10.5)
nRBC: 0 % (ref 0.0–0.2)

## 2018-08-14 LAB — ABO/RH: ABO/RH(D): O POS

## 2018-08-14 LAB — RPR: RPR Ser Ql: NONREACTIVE

## 2018-08-14 MED ORDER — TETANUS-DIPHTH-ACELL PERTUSSIS 5-2.5-18.5 LF-MCG/0.5 IM SUSP: 0.5000 mL | Freq: Once | INTRAMUSCULAR | Status: DC

## 2018-08-14 MED ORDER — BENZOCAINE-MENTHOL 20-0.5 % EX AERO
1.0000 "application " | INHALATION_SPRAY | CUTANEOUS | Status: DC | PRN
  Administered 2018-08-14: 1 via TOPICAL
  Filled 2018-08-14 (×2): qty 56

## 2018-08-14 MED ORDER — DIPHENHYDRAMINE HCL 25 MG PO CAPS: 25.0000 mg | ORAL_CAPSULE | Freq: Four times a day (QID) | ORAL | Status: DC | PRN

## 2018-08-14 MED ORDER — PRENATAL MULTIVITAMIN CH
1.0000 | ORAL_TABLET | Freq: Every day | ORAL | Status: DC
  Administered 2018-08-15 – 2018-08-17 (×2): 1 via ORAL
  Filled 2018-08-14 (×2): qty 1

## 2018-08-14 MED ORDER — ZOLPIDEM TARTRATE 5 MG PO TABS: 5.0000 mg | ORAL_TABLET | Freq: Every evening | ORAL | Status: DC | PRN

## 2018-08-14 MED ORDER — WITCH HAZEL-GLYCERIN EX PADS: 1.0000 "application " | MEDICATED_PAD | CUTANEOUS | Status: DC | PRN

## 2018-08-14 MED ORDER — ACETAMINOPHEN 325 MG PO TABS
650.0000 mg | ORAL_TABLET | ORAL | Status: DC | PRN
  Administered 2018-08-14 – 2018-08-15 (×4): 650 mg via ORAL
  Filled 2018-08-14 (×4): qty 2

## 2018-08-14 MED ORDER — ONDANSETRON HCL 4 MG/2ML IJ SOLN: 4.0000 mg | INTRAMUSCULAR | Status: DC | PRN

## 2018-08-14 MED ORDER — OXYCODONE HCL 5 MG PO TABS
5.0000 mg | ORAL_TABLET | ORAL | Status: DC | PRN
  Administered 2018-08-15 – 2018-08-17 (×6): 5 mg via ORAL
  Filled 2018-08-14 (×8): qty 1

## 2018-08-14 MED ORDER — SIMETHICONE 80 MG PO CHEW: 80.0000 mg | CHEWABLE_TABLET | ORAL | Status: DC | PRN

## 2018-08-14 MED ORDER — SENNOSIDES-DOCUSATE SODIUM 8.6-50 MG PO TABS
2.0000 | ORAL_TABLET | ORAL | Status: DC
  Administered 2018-08-15 – 2018-08-16 (×3): 2 via ORAL
  Filled 2018-08-14 (×3): qty 2

## 2018-08-14 MED ORDER — DIBUCAINE (PERIANAL) 1 % EX OINT
1.0000 "application " | TOPICAL_OINTMENT | CUTANEOUS | Status: DC | PRN
  Administered 2018-08-15: 1 via RECTAL
  Filled 2018-08-14: qty 28

## 2018-08-14 MED ORDER — IBUPROFEN 600 MG PO TABS
600.0000 mg | ORAL_TABLET | Freq: Four times a day (QID) | ORAL | Status: DC
  Administered 2018-08-14 – 2018-08-15 (×4): 600 mg via ORAL
  Filled 2018-08-14 (×5): qty 1

## 2018-08-14 MED ORDER — ONDANSETRON HCL 4 MG PO TABS: 4.0000 mg | ORAL_TABLET | ORAL | Status: DC | PRN

## 2018-08-14 MED ORDER — COCONUT OIL OIL: 1.0000 "application " | TOPICAL_OIL | Status: DC | PRN

## 2018-08-14 NOTE — Progress Notes (Signed)
Patient ID: [facility], female   DOB: 02/23/1996 23 y.o.   MRN: [id number]  Epidural working, perineal burning per pt. No rectal/ vag pressure   BP (!) 138/97 (BP Location: Right Arm)   Pulse (!) 133   Temp 98.4 F (36.9 C) (Oral)   Resp 20   Ht 5\' 2"  (1.575 m)   Wt 67.6 kg   BMI 27.25 kg/m    FHT 120s - cat I Toco q 2 -3 min, spontaneous (s/p 1 cytotec 50 mcg PO at 4 pm) SVE -Complete, +2 per RN.    Start pushing.   V.Earla Charlie MD

## 2018-08-14 NOTE — Lactation Note (Signed)
This note was copied from a baby's chart. Lactation Consultation Note  Patient Name: Cassandra Stone HENID'P Date: 08/14/2018 Reason for consult: Initial assessment;Primapara;1st time breastfeeding;Term  Visited with P1 Mom of term baby at 10 hrs old.  Baby resting in bed with baby swaddled on her chest.  Baby has fed 4 times, 20 mins each time.  Mom denies any difficulty with latching.    Assisted Mom with removing blanket swaddle and placing baby STS.  Baby immediately started cueing.  Reviewed and demonstrated breast massage and hand expression.  Colostrum easily expressed. Baby latched easily and mouth wide onto areola.  Mom denies any discomfort.    Encouraged STS and cue based feedings, goal of >8 feedings per 24 hrs.    Lactation brochure and flyer for Virtual Breastfeeding Support Group given to Mom.  Mom aware of IP and OP Lactation services available to her.   Encouraged Mom to call prn for assistance.  Maternal Data Formula Feeding for Exclusion: No Has patient been taught Hand Expression?: Yes Does the patient have breastfeeding experience prior to this delivery?: No  Feeding Feeding Type: Breast Fed  LATCH Score Latch: Grasps breast easily, tongue down, lips flanged, rhythmical sucking.  Audible Swallowing: Spontaneous and intermittent  Type of Nipple: Everted at rest and after stimulation  Comfort (Breast/Nipple): Soft / non-tender  Hold (Positioning): Assistance needed to correctly position infant at breast and maintain latch.  LATCH Score: 9  Interventions Interventions: Breast feeding basics reviewed;Assisted with latch;Skin to skin;Breast massage;Hand express;Breast compression;Adjust position;Support pillows;Position options;Expressed milk   Consult Status Consult Status: Follow-up Date: 08/15/18 Follow-up type: In-patient    Judee Clara 08/14/2018, 10:28 AM

## 2018-08-14 NOTE — Anesthesia Postprocedure Evaluation (Signed)
Anesthesia Post Note  Patient: Cassandra Stone  Procedure(s) Performed: AN AD HOC LABOR EPIDURAL     Patient location during evaluation: Mother Baby Anesthesia Type: Epidural Level of consciousness: awake Pain management: satisfactory to patient Vital Signs Assessment: post-procedure vital signs reviewed and stable Respiratory status: spontaneous breathing Cardiovascular status: stable Anesthetic complications: no    Last Vitals:  Vitals:   08/14/18 0745 08/14/18 1253  BP: 125/70 113/63  Pulse: 91 71  Resp: 16   Temp: 37.1 C 36.8 C  SpO2:      Last Pain:  Vitals:   08/14/18 1253  TempSrc: Oral  PainSc:    Pain Goal: Patients Stated Pain Goal: 2 (08/13/18 2100)                 Cephus Shelling

## 2018-08-15 ENCOUNTER — Inpatient Hospital Stay (HOSPITAL_COMMUNITY): Payer: PPO

## 2018-08-15 ENCOUNTER — Other Ambulatory Visit: Payer: Self-pay | Admitting: Podiatry

## 2018-08-15 DIAGNOSIS — S99921S Unspecified injury of right foot, sequela: Secondary | ICD-10-CM

## 2018-08-15 DIAGNOSIS — O9902 Anemia complicating childbirth: Secondary | ICD-10-CM | POA: Diagnosis present

## 2018-08-15 DIAGNOSIS — L923 Foreign body granuloma of the skin and subcutaneous tissue: Secondary | ICD-10-CM

## 2018-08-15 MED ORDER — POLYSACCHARIDE IRON COMPLEX 150 MG PO CAPS
150.0000 mg | ORAL_CAPSULE | Freq: Every day | ORAL | Status: DC
  Administered 2018-08-15 – 2018-08-17 (×2): 150 mg via ORAL
  Filled 2018-08-15 (×2): qty 1

## 2018-08-15 MED ORDER — MAGNESIUM OXIDE 400 (241.3 MG) MG PO TABS
400.0000 mg | ORAL_TABLET | Freq: Every day | ORAL | Status: DC
  Administered 2018-08-15 – 2018-08-17 (×2): 400 mg via ORAL
  Filled 2018-08-15 (×2): qty 1

## 2018-08-15 MED ORDER — ACETAMINOPHEN 500 MG PO TABS
1000.0000 mg | ORAL_TABLET | Freq: Four times a day (QID) | ORAL | Status: DC
  Administered 2018-08-15 – 2018-08-17 (×7): 1000 mg via ORAL
  Filled 2018-08-15 (×7): qty 2

## 2018-08-15 MED ORDER — IBUPROFEN 600 MG PO TABS
600.0000 mg | ORAL_TABLET | Freq: Four times a day (QID) | ORAL | Status: DC
  Administered 2018-08-15 – 2018-08-17 (×8): 600 mg via ORAL
  Filled 2018-08-15 (×8): qty 1

## 2018-08-15 NOTE — Consult Note (Signed)
Reason for Consult: Foreign body Referring Physician: Arlan Organ  [facility] Cassandra Stone is an 23 y.o. female.  HPI: 23 year old female recently gave birth yesterday however she states that she stepped on a foreign object found on the bathroom approximately 3 weeks ago.  She had some onset of pain at the time she went to emergency room.  No x-rays were performed.  She states that the ER tried for about an hour to get the foreign object out but unable to do so.  Since then she has had increased continued pain to the bottom of her right heel.  She states that her tetanus is up-to-date.  She denies any drainage or pus but minimal swelling to the bottom of the heel.  Past Medical History:  Diagnosis Date  . GERD (gastroesophageal reflux disease)   . H. pylori infection    2014, treated    Past Surgical History:  Procedure Laterality Date  . NO PAST SURGERIES      History reviewed. No pertinent family history.  Social History:  reports that she has never smoked. She has never used smokeless tobacco. She reports that she does not drink alcohol or use drugs.  Allergies: No Known Allergies  Medications: Reviewed  Results for orders placed or performed during the hospital encounter of 08/13/18 (from the past 48 hour(s))  CBC     Status: Abnormal   Collection Time: 08/13/18  3:30 PM  Result Value Ref Range   WBC 10.8 (H) 4.0 - 10.5 K/uL   RBC 4.65 3.87 - 5.11 MIL/uL   Hemoglobin 11.4 (L) 12.0 - 15.0 g/dL   HCT 21.9 75.8 - 83.2 %   MCV 78.5 (L) 80.0 - 100.0 fL   MCH 24.5 (L) 26.0 - 34.0 pg   MCHC 31.2 30.0 - 36.0 g/dL   RDW 54.9 82.6 - 41.5 %   Platelets 318 150 - 400 K/uL   nRBC 0.0 0.0 - 0.2 %    Comment: Performed at [facility], [address] [place], [place] [address]  Type and screen [facility]     Status: None   Collection Time: 08/13/18  3:30 PM  Result Value Ref Range   ABO/RH(D) O POS    Antibody Screen NEG    Sample Expiration     08/16/2018 Performed at [facility], [address] [place], [place] [address]   RPR     Status: None   Collection Time: 08/13/18  3:30 PM  Result Value Ref Range   RPR Ser Ql Non Reactive Non Reactive    Comment: (NOTE) Performed At: [facility] [address] [place], [place] [id number] Jolene Schimke MD YV:[id number]   Comprehensive metabolic panel     Status: Abnormal   Collection Time: 08/13/18  3:30 PM  Result Value Ref Range   Sodium 136 135 - 145 mmol/L   Potassium 4.1 3.5 - 5.1 mmol/L   Chloride 105 98 - 111 mmol/L   CO2 20 (L) 22 - 32 mmol/L   Glucose, Bld 85 70 - 99 mg/dL   BUN 7 6 - 20 mg/dL   Creatinine, Ser 8.63 0.44 - 1.00 mg/dL   Calcium 9.0 8.9 - 81.7 mg/dL   Total Protein 6.4 (L) 6.5 - 8.1 g/dL   Albumin 3.1 (L) 3.5 - 5.0 g/dL   AST 23 15 - 41 U/L   ALT 13 0 - 44 U/L   Alkaline Phosphatase 178 (H) 38 - 126 U/L  Total Bilirubin 0.6 0.3 - 1.2 mg/dL   GFR calc non Af Amer >60 >60 mL/min   GFR calc Af Amer >60 >60 mL/min   Anion gap 11 5 - 15    Comment: Performed at [facility], [address], [place], [place] [address]  Uric acid     Status: None   Collection Time: 08/13/18  3:30 PM  Result Value Ref Range   Uric Acid, Serum 4.1 2.5 - 7.1 mg/dL    Comment: Performed at [facility], [address], [place], [place] [address]  ABO/Rh     Status: None   Collection Time: 08/13/18  3:30 PM  Result Value Ref Range   ABO/RH(D) O POS    No rh immune globuloin      NOT A RH IMMUNE GLOBULIN CANDIDATE, PT RH POSITIVE Performed at [facility], [address] [place], [place] [address]   Protein / creatinine ratio, urine     Status: None   Collection Time: 08/13/18  3:57 PM  Result Value Ref Range   Creatinine, Urine 115.65 mg/dL   Total Protein, Urine 10 mg/dL    Comment: NO NORMAL RANGE ESTABLISHED FOR THIS TEST   Protein Creatinine Ratio 0.09 0.00 - 0.15 mg/mg[Cre]    Comment: Performed at [facility],  [address], [place], [place] [address]  CBC     Status: Abnormal   Collection Time: 08/14/18  7:38 AM  Result Value Ref Range   WBC 14.6 (H) 4.0 - 10.5 K/uL   RBC 3.66 (L) 3.87 - 5.11 MIL/uL   Hemoglobin 9.1 (L) 12.0 - 15.0 g/dL    Comment: REPEATED TO VERIFY   HCT 28.3 (L) 36.0 - 46.0 %   MCV 77.3 (L) 80.0 - 100.0 fL   MCH 24.9 (L) 26.0 - 34.0 pg   MCHC 32.2 30.0 - 36.0 g/dL   RDW 29.5 62.1 - 30.8 %   Platelets 259 150 - 400 K/uL   nRBC 0.0 0.0 - 0.2 %    Comment: Performed at [facility], [address], [place], [place] [address]    Dg Foot Complete Right  Result Date: 08/15/2018 CLINICAL DATA:  Stepped on a needle March 28. EXAM: RIGHT FOOT COMPLETE - 3+ VIEW COMPARISON:  None. FINDINGS: There is no evidence of fracture or dislocation. There is no evidence of arthropathy or other focal bone abnormality. There is a fractured metallic needle in the plantar medial soft tissues overlying the posterior calcaneus. The more superficial fragment measures 3 mm in length and the more deep metallic fragment measures 15 mm in length. IMPRESSION: Fractured metallic needle in the plantar medial soft tissues overlying the posterior calcaneus. Superficial fragment measures 3 mm in length and the deep metallic fragment measures 15 mm in length. Electronically Signed   By: Elige Ko   On: 08/15/2018 10:47    ROS Blood pressure (!) 125/57, pulse 91, temperature 98.1 F (36.7 C), temperature source Oral, resp. rate 16, height  (1.575 m), weight 67.6 kg, SpO2 99 %, unknown if currently breastfeeding. Physical Exam General: AAO x3, NAD  Dermatological: There is a puncture wound present on the plantar aspect of the right heel with minimal edema there is no surrounding erythema there is no drainage or pus.  No open lesions.  Vascular: Dorsalis Pedis artery and Posterior Tibial artery pedal pulses are 2/4 bilateral with immedate capillary fill time. There is no pain with calf  compression,  swelling, warmth, erythema.   Neruologic: Grossly intact via light touch bilateral.  Protective threshold with Semmes Wienstein monofilament intact to all pedal sites bilateral.   Musculoskeletal: There is tenderness to palpation along the area of the puncture wound to the plantar right heel.  Muscular strength 5/5 in all groups tested bilateral.    Assessment/Plan: Foreign object right heel  -Treatment options discussed including all alternatives, risks, and complications -X-rays reviewed with her.  There is metallic foreign object present in the inferior portion of the heel. -At this time I discussed with her surgical excision of the foreign body.  Given how big is accompanied this can be done at bedside.  We discussed the procedure as well as the postoperative course.  Alternatives, risks, complications were discussed.  We discussed risks including, but not limited to, infection, unable to give the outcome of breakage of the foreign body,scar, DVT, loss of foot under general risks of surgery. -We will plan for right foot foreign body removal tomorrow. -N.p.o. after midnight -We will likely prophylax with Keflex postoperatively.   Vivi BarrackMatthew R Mahsa Hanser 08/15/2018, 1:10 PM

## 2018-08-15 NOTE — Lactation Note (Signed)
This note was copied from a baby's chart. Lactation Consultation Note LC received call that mom asking for formula d/t baby hungry all the time. Mom was explained newborn feeding habits, cluster feeding by staff. LC went to rm. Asking if mom had questions and asking for formula. Mom in BR, door 1/2 opened, stating she didn't want the formula any more, she has changed her mind. Asked mom to call for assistance for feeding or further questions.  Patient Name: Cassandra Stone GGYIR'S Date: 08/15/2018     Maternal Data    Feeding    LATCH Score                   Interventions    Lactation Tools Discussed/Used     Consult Status      Cassandra Stone, Cassandra Stone 08/15/2018, 5:30 AM

## 2018-08-15 NOTE — Progress Notes (Signed)
OR contacted, pt to have surgery tomorrow at 09:52

## 2018-08-15 NOTE — Progress Notes (Signed)
PPD # 1 S/P VAVD  Live born female  Birth Weight: 7 lb 0.2 oz (3180 g) APGAR: 8, 10  Newborn Delivery   Birth date/time:  08/14/2018 03:06:00 Delivery type:  Vaginal, Vacuum (Extractor)    Delivering provider: MODY, VAISHALI  Episiotomy:Left Mediolateral   Lacerations:2nd degree   circumcision planned  Feeding: breast  Pain control at delivery: Epidural   S:  Reports feeling lots of pain "down there", unable to move in bed without significant pain, ibuprofen has not been helping.  Also reports stepping on unknown object at home in her bedroom and it got imbedded in her right heel pad, unable to remove at outpatient visit, doctor told her it would eventually work it's way out. Patient thinks it may be a piece of glass. Denies infection in area, no antibiotics tx to date.             Tolerating po/ No nausea or vomiting             Bleeding is light                         Up ad lib but with difficulty d/t perineal and foot pain, voiding without difficulties   O:  A & O x 3, in no apparent distress              VS:  Vitals:   08/14/18 1253 08/14/18 1600 08/14/18 2122 08/15/18 0600  BP: 113/63 127/85 114/62 (!) 125/57  Pulse: 71 88 81 91  Resp:  18 16 16   Temp: 98.2 F (36.8 C) 98.3 F (36.8 C) 98.5 F (36.9 C) 98.1 F (36.7 C)  TempSrc: Oral Oral Oral Oral  SpO2:  99%  99%  Weight:      Height:        LABS:  Recent Labs    08/13/18 1530 08/14/18 0738  WBC 10.8* 14.6*  HGB 11.4* 9.1*  HCT 36.5 28.3*  PLT 318 259    Blood type: --/--/O POS, O POS (04/19 1530)  Rubella: Immune (10/01 0000)   I&O: I/O last 3 completed shifts: In: -  Out: 1595 [Urine:1450; Blood:145]          No intake/output data recorded.  Vaccines: TDaP UTD         Flu    declined   Lungs: Clear and unlabored  Heart: regular rate and rhythm / no murmurs  Abdomen: soft, non-tender, non-distended             Fundus: firm, non-tender, U-1  Perineum: repair intact, no signs of hematoma,  minimal edema, tender to palp  Lochia: small  Extremities: no edema, no calf pain or tenderness  R heel pad with mild edema, no redness, noted puncture mark w/o drainage  A/P: PPD # 1 22 y.o., [id number]   Principal Problem:   Postpartum care following VAVD (4/20) Active Problems:   Term pregnancy   Vacuum-assisted vaginal delivery and L mediolateral episiotomy   Maternal anemia, with delivery  - oral Fe and Mag ox   Foot injury, left, sequela  - foot x-ray pending  - consult to podiatry inpatient in order to decrease outpatient exposure to mother with newborn Suboptimal pain control at episiotomy site  - scheduled alternating PO motrin and Tylenol, Roxicodone PRN   Stable status  Routine post partum orders     Neta Mends, MSN, CNM 08/15/2018, 9:40 AM

## 2018-08-15 NOTE — Lactation Note (Signed)
This note was copied from a baby's chart. Lactation Consultation Note  Patient Name: Cassandra Stone Date: 08/15/2018 Reason for consult: Follow-up assessment;Term;Primapara;1st time breastfeeding  P1 mother whose infant is now 16 hours old.  Mother was holding baby when I arrived.  She stated she had recently fed him but she cannot seem to lay him in the bassinet without him crying.  I offered to swaddle him and she accepted.  Baby was swaddled and placed in bassinet.  He remained sleeping.  Mother was very Adult nurse.  She stated he is feeding well and she can hear him swallowing.  She can express colostrum.  Offered to assist with latching as needed.  Mother knows how to call for assistance.   Maternal Data Formula Feeding for Exclusion: No Has patient been taught Hand Expression?: Yes Does the patient have breastfeeding experience prior to this delivery?: No  Feeding Feeding Type: Breast Fed  LATCH Score                   Interventions    Lactation Tools Discussed/Used     Consult Status Consult Status: Follow-up Date: 08/16/18 Follow-up type: In-patient    Dora Sims 08/15/2018, 4:38 PM

## 2018-08-15 NOTE — Progress Notes (Signed)
Call made to [facility], as per the podiatrist  on call at [facility].  Answering service stated they will call back regarding seeing this patient.

## 2018-08-16 ENCOUNTER — Telehealth: Payer: Self-pay | Admitting: *Deleted

## 2018-08-16 ENCOUNTER — Inpatient Hospital Stay (HOSPITAL_COMMUNITY): Payer: PPO | Admitting: Registered Nurse

## 2018-08-16 ENCOUNTER — Encounter (HOSPITAL_COMMUNITY): Payer: Self-pay

## 2018-08-16 ENCOUNTER — Encounter (HOSPITAL_COMMUNITY)
Admission: AD | Disposition: A | Discharge status: Home / Self Care | Payer: Self-pay | Attending: Obstetrics & Gynecology

## 2018-08-16 DIAGNOSIS — L923 Foreign body granuloma of the skin and subcutaneous tissue: Secondary | ICD-10-CM

## 2018-08-16 HISTORY — PX: FOREIGN BODY REMOVAL: SHX962

## 2018-08-16 LAB — SURGICAL PCR SCREEN
MRSA, PCR: NEGATIVE
Staphylococcus aureus: POSITIVE — AB

## 2018-08-16 SURGERY — REMOVAL FOREIGN BODY EXTREMITY
Anesthesia: Monitor Anesthesia Care | Laterality: Right

## 2018-08-16 MED ORDER — PROPOFOL 10 MG/ML IV BOLUS
INTRAVENOUS | Status: AC
  Filled 2018-08-16: qty 20

## 2018-08-16 MED ORDER — FENTANYL CITRATE (PF) 250 MCG/5ML IJ SOLN
INTRAMUSCULAR | Status: DC | PRN
  Administered 2018-08-16: 50 ug via INTRAVENOUS
  Administered 2018-08-16: 25 ug via INTRAVENOUS

## 2018-08-16 MED ORDER — FENTANYL CITRATE (PF) 100 MCG/2ML IJ SOLN: 25.0000 ug | INTRAMUSCULAR | Status: DC | PRN

## 2018-08-16 MED ORDER — CEFAZOLIN SODIUM-DEXTROSE 2-4 GM/100ML-% IV SOLN
2.0000 g | INTRAVENOUS | Status: AC
  Administered 2018-08-16: 11:00:00 2 g via INTRAVENOUS
  Filled 2018-08-16 (×2): qty 100

## 2018-08-16 MED ORDER — CHLORHEXIDINE GLUCONATE CLOTH 2 % EX PADS: 6.0000 | MEDICATED_PAD | Freq: Once | CUTANEOUS | Status: DC

## 2018-08-16 MED ORDER — LIDOCAINE-EPINEPHRINE 1 %-1:100000 IJ SOLN
INTRAMUSCULAR | Status: AC
  Filled 2018-08-16: qty 1

## 2018-08-16 MED ORDER — OXYCODONE HCL 5 MG/5ML PO SOLN: 5.0000 mg | Freq: Once | ORAL | Status: DC | PRN

## 2018-08-16 MED ORDER — BUPIVACAINE HCL (PF) 0.5 % IJ SOLN
INTRAMUSCULAR | Status: AC
  Filled 2018-08-16: qty 30

## 2018-08-16 MED ORDER — FENTANYL CITRATE (PF) 250 MCG/5ML IJ SOLN
INTRAMUSCULAR | Status: AC
  Filled 2018-08-16: qty 5

## 2018-08-16 MED ORDER — 0.9 % SODIUM CHLORIDE (POUR BTL) OPTIME
TOPICAL | Status: DC | PRN
  Administered 2018-08-16: 11:00:00 1000 mL

## 2018-08-16 MED ORDER — MIDAZOLAM HCL 5 MG/5ML IJ SOLN
INTRAMUSCULAR | Status: DC | PRN
  Administered 2018-08-16 (×2): 1 mg via INTRAVENOUS

## 2018-08-16 MED ORDER — LIDOCAINE-EPINEPHRINE 1 %-1:100000 IJ SOLN
INTRAMUSCULAR | Status: DC | PRN
  Administered 2018-08-16: 5 mL

## 2018-08-16 MED ORDER — OXYCODONE HCL 5 MG PO TABS: 5.0000 mg | ORAL_TABLET | Freq: Once | ORAL | Status: DC | PRN

## 2018-08-16 MED ORDER — ACETAMINOPHEN 10 MG/ML IV SOLN: 1000.0000 mg | Freq: Once | INTRAVENOUS | Status: DC | PRN

## 2018-08-16 MED ORDER — MIDAZOLAM HCL 2 MG/2ML IJ SOLN
INTRAMUSCULAR | Status: AC
  Filled 2018-08-16: qty 2

## 2018-08-16 MED ORDER — LACTATED RINGERS IV SOLN
INTRAVENOUS | Status: DC
  Administered 2018-08-16: 10:00:00 via INTRAVENOUS

## 2018-08-16 MED ORDER — LIDOCAINE-EPINEPHRINE (PF) 1.5 %-1:200000 IJ SOLN
INTRAMUSCULAR | Status: DC | PRN
  Administered 2018-08-16: 15 mL via PERINEURAL

## 2018-08-16 MED ORDER — LACTATED RINGERS IV SOLN
INTRAVENOUS | Status: DC | PRN
  Administered 2018-08-16: 11:00:00 via INTRAVENOUS

## 2018-08-16 MED ORDER — BUPIVACAINE HCL (PF) 0.5 % IJ SOLN
INTRAMUSCULAR | Status: DC | PRN
  Administered 2018-08-16: 30 mL

## 2018-08-16 MED ORDER — ACETAMINOPHEN 160 MG/5ML PO SOLN: 1000.0000 mg | Freq: Once | ORAL | Status: DC | PRN

## 2018-08-16 MED ORDER — ACETAMINOPHEN 500 MG PO TABS: 1000.0000 mg | ORAL_TABLET | Freq: Once | ORAL | Status: DC | PRN

## 2018-08-16 SURGICAL SUPPLY — 44 items
BANDAGE ACE 3X5.8 VEL STRL LF (GAUZE/BANDAGES/DRESSINGS) ×3 IMPLANT
BLADE LONG MED 31MMX9MM (MISCELLANEOUS)
BLADE LONG MED 31X9 (MISCELLANEOUS) IMPLANT
BNDG CONFORM 2 STRL LF (GAUZE/BANDAGES/DRESSINGS) IMPLANT
BNDG ESMARK 4X9 LF (GAUZE/BANDAGES/DRESSINGS) IMPLANT
BNDG GAUZE ELAST 4 BULKY (GAUZE/BANDAGES/DRESSINGS) ×3 IMPLANT
CANISTER SUCT 3000ML PPV (MISCELLANEOUS) ×3 IMPLANT
COVER SURGICAL LIGHT HANDLE (MISCELLANEOUS) ×3 IMPLANT
COVER WAND RF STERILE (DRAPES) ×3 IMPLANT
CUFF TOURNIQUET SINGLE 18IN (TOURNIQUET CUFF) IMPLANT
DECANTER SPIKE VIAL GLASS SM (MISCELLANEOUS) ×3 IMPLANT
DRSG EMULSION OIL 3X3 NADH (GAUZE/BANDAGES/DRESSINGS) ×3 IMPLANT
DURAPREP 26ML APPLICATOR (WOUND CARE) ×3 IMPLANT
ELECT REM PT RETURN 9FT ADLT (ELECTROSURGICAL) ×3
ELECTRODE REM PT RTRN 9FT ADLT (ELECTROSURGICAL) ×1 IMPLANT
GAUZE SPONGE 4X4 12PLY STRL (GAUZE/BANDAGES/DRESSINGS) IMPLANT
GAUZE SPONGE 4X4 12PLY STRL LF (GAUZE/BANDAGES/DRESSINGS) ×3 IMPLANT
GLOVE BIO SURGEON STRL SZ7.5 (GLOVE) ×6 IMPLANT
GLOVE BIOGEL M STER SZ 6 (GLOVE) ×3 IMPLANT
GLOVE BIOGEL PI IND STRL 6 (GLOVE) ×1 IMPLANT
GLOVE BIOGEL PI IND STRL 7.5 (GLOVE) ×1 IMPLANT
GLOVE BIOGEL PI INDICATOR 6 (GLOVE) ×2
GLOVE BIOGEL PI INDICATOR 7.5 (GLOVE) ×2
GOWN STRL REUS W/ TWL LRG LVL3 (GOWN DISPOSABLE) ×2 IMPLANT
GOWN STRL REUS W/ TWL XL LVL3 (GOWN DISPOSABLE) ×1 IMPLANT
GOWN STRL REUS W/TWL LRG LVL3 (GOWN DISPOSABLE) ×4
GOWN STRL REUS W/TWL XL LVL3 (GOWN DISPOSABLE) ×2
KIT BASIN OR (CUSTOM PROCEDURE TRAY) ×3 IMPLANT
NDL SAFETY ECLIPSE 18X1.5 (NEEDLE) IMPLANT
NEEDLE HYPO 18GX1.5 SHARP (NEEDLE)
NEEDLE HYPO 25X1 1.5 SAFETY (NEEDLE) ×3 IMPLANT
NS IRRIG 1000ML POUR BTL (IV SOLUTION) IMPLANT
PACK ORTHO EXTREMITY (CUSTOM PROCEDURE TRAY) IMPLANT
PADDING CAST ABS 4INX4YD NS (CAST SUPPLIES) ×2
PADDING CAST ABS COTTON 4X4 ST (CAST SUPPLIES) ×1 IMPLANT
SUT MNCRL AB 3-0 PS2 18 (SUTURE) ×3 IMPLANT
SUT MNCRL AB 4-0 PS2 18 (SUTURE) IMPLANT
SUT MON AB 5-0 PS2 18 (SUTURE) IMPLANT
SUT PROLENE 4 0 PS 2 18 (SUTURE) ×3 IMPLANT
SYR 10ML LL (SYRINGE) IMPLANT
SYR CONTROL 10ML LL (SYRINGE) ×3 IMPLANT
TUBE CONNECTING 12'X1/4 (SUCTIONS) ×1
TUBE CONNECTING 12X1/4 (SUCTIONS) ×2 IMPLANT
UNDERPAD 30X30 (UNDERPADS AND DIAPERS) ×3 IMPLANT

## 2018-08-16 NOTE — Progress Notes (Signed)
Dr. Gabriel Rung office was called with questions regarding patients restrictions with her foot. It is okay to use the foot OOB as long as her cam boot is on. Otherwise she will have the boot off while in the bed or sitting down. Royston Cowper, RN

## 2018-08-16 NOTE — Lactation Note (Signed)
This note was copied from a baby's chart. Lactation Consultation Note  Patient Name: Cassandra Stone GGEZM'O Date: 08/16/2018 Reason for consult: Follow-up assessment;Term;Primapara;1st time breastfeeding  P1 mother whose infant is now 42 hours old.  Mother had surgery this morning for a foreign object (needle) in her foot.  Baby was sleeping in bassinet when I arrived.  Mother had just recently breast fed.  She stated that her breasts were "hurting."  Upon examination, I found her right breast to be engorged.  She has not been feeding baby on the right breast for the last couple of feedings because she "couldn't get comfortable."  Suggested we begin pumping with the DEBP and use ice to help relieve engorgement.  Mother approved.  DEBP initiated.  Pump parts, assembly, disassembly and cleaning reviewed.  #24 flange size is appropriate.  Observed mother pumping for a few minutes and milk was easily flowing out of the right breast with small amounts out of the left breast.  Mother will call her RN/LC after pumping so we can initiate ice packs.  Mother was appreciative of help.  RN updated.   Maternal Data Formula Feeding for Exclusion: No Has patient been taught Hand Expression?: Yes Does the patient have breastfeeding experience prior to this delivery?: No  Feeding Feeding Type: Breast Fed  LATCH Score                   Interventions    Lactation Tools Discussed/Used WIC Program: No Pump Review: Setup, frequency, and cleaning;Milk Storage Initiated by:: Skylarr Liz Date initiated:: 08/16/18   Consult Status Consult Status: Follow-up Date: 08/17/18 Follow-up type: In-patient    Dora Sims 08/16/2018, 3:42 PM

## 2018-08-16 NOTE — Anesthesia Procedure Notes (Signed)
Anesthesia Regional Block: Popliteal block   Pre-Anesthetic Checklist: ,, timeout performed, Correct Patient, Correct Site, Correct Laterality, Correct Procedure, Correct Position, site marked, Risks and benefits discussed,  Surgical consent,  Pre-op evaluation,  At surgeon's request and post-op pain management  Laterality: Right and Lower  Prep: chloraprep       Needles:  Injection technique: Single-shot     Needle Length: 9cm  Needle Gauge: 22     Additional Needles: Arrow StimuQuik ECHO Echogenic Stimulating PNB Needle  Procedures:,,,, ultrasound used (permanent image in chart),,,,  Narrative:  Start time: 08/16/2018 10:49 AM End time: 08/16/2018 10:53 AM Injection made incrementally with aspirations every 5 mL.  Performed by: Personally  Anesthesiologist: Val Eagle, MD

## 2018-08-16 NOTE — Telephone Encounter (Signed)
Yes, that is fine.  Thank you.

## 2018-08-16 NOTE — Anesthesia Procedure Notes (Signed)
Date/Time: 08/16/2018 11:15 AM Performed by: Zollie Scale, CRNA Pre-anesthesia Checklist: Patient identified, Emergency Drugs available, Suction available, Patient being monitored and Timeout performed Patient Re-evaluated:Patient Re-evaluated prior to induction Oxygen Delivery Method: Nasal cannula Preoxygenation: Pre-oxygenation with 100% oxygen

## 2018-08-16 NOTE — Progress Notes (Signed)
Subjective: Cassandra Stone is 2 days after vaginal delivery of baby boy. She stepped on a foreign body about 3 weeks ago and went to the ER. She states they attempted for one hour to get the foreign object out but was unsuccessful. Podiatry was consulted for foreign body removal. Due to COVID 19 elected to remove foreign object while in the hospital to help prevent possible exposure. She was seen today in the pre-op area.  Denies any systemic complaints such as fevers, chills, nausea, vomiting. No acute changes since last appointment, and no other complaints at this time.   Objective: AAO x3, NAD, nervous about surgery DP/PT pulses palpable bilaterally, CRT less than 3 seconds Tenderness along an area of the puncture wound on the plantar right heel. No drainage or pus.   No open lesions or pre-ulcerative lesions.  No pain with calf compression, swelling, warmth, erythema  Assessment: Foreign body right foot  Plan: -All treatment options discussed with the patient including all alternatives, risks, complications.  -Again reviewed the surgical consent form. All alternatives, risks, complications discussed. Surgical consent form was signed -NPO -Ancef for preop ppx -Will plan for surgery as scheduled for removal of foreign body.   Ovid Curd, DPM O: [phone] C: [phone]

## 2018-08-16 NOTE — Anesthesia Preprocedure Evaluation (Signed)
Anesthesia Evaluation  Patient identified by MRN, date of birth, ID band Patient awake    Reviewed: Allergy & Precautions, NPO status , Patient's Chart, lab work & pertinent test results  History of Anesthesia Complications Negative for: history of anesthetic complications  Airway Mallampati: I  TM Distance: >3 FB Neck ROM: Full    Dental  (+) Teeth Intact, Dental Advisory Given   Pulmonary neg pulmonary ROS,    breath sounds clear to auscultation       Cardiovascular negative cardio ROS   Rhythm:Regular     Neuro/Psych negative neurological ROS     GI/Hepatic Neg liver ROS, GERD  ,  Endo/Other  negative endocrine ROS  Renal/GU negative Renal ROS     Musculoskeletal   Abdominal   Peds  Hematology  (+) Blood dyscrasia, anemia ,   Anesthesia Other Findings   Reproductive/Obstetrics Delivered 4/20                             Anesthesia Physical Anesthesia Plan  ASA: II  Anesthesia Plan: MAC and Regional   Post-op Pain Management:    Induction:   PONV Risk Score and Plan: 2 and Treatment may vary due to age or medical condition  Airway Management Planned: Nasal Cannula  Additional Equipment: None  Intra-op Plan:   Post-operative Plan:   Informed Consent: I have reviewed the patients History and Physical, chart, labs and discussed the procedure including the risks, benefits and alternatives for the proposed anesthesia with the patient or authorized representative who has indicated his/her understanding and acceptance.     Dental advisory given  Plan Discussed with: CRNA and Surgeon  Anesthesia Plan Comments:         Anesthesia Quick Evaluation

## 2018-08-16 NOTE — Op Note (Signed)
PRE-OPERATIVE DIAGNOSIS:  Foreign body right foot  POST-OPERATIVE DIAGNOSIS:  * No post-op diagnosis entered *  PROCEDURE:  Procedure(s) with comments: REMOVAL FOREIGN BODY EXTREMITY (Right) - OK for anesthesia to do block  SURGEON:  Surgeon(s) and Role:    * Vivi Barrack, DPM - Primary  PHYSICIAN ASSISTANT:   ASSISTANTS: none   ANESTHESIA:   regional  EBL:  10 mL   BLOOD ADMINISTERED:none  DRAINS: none   LOCAL MEDICATIONS USED:  OTHER 5 cc 1% lidocaine with ep  SPECIMEN:  No Specimen  DISPOSITION OF SPECIMEN:  N/A  COUNTS:  YES  TOURNIQUET:  * No tourniquets in log *  DICTATION: .Dragon Dictation  PLAN OF CARE: inpatient  PATIENT DISPOSITION:  PACU - hemodynamically stable.   Delay start of Pharmacological VTE agent (>24hrs) due to surgical blood loss or risk of bleeding: no  Indications for surgery: 23 year old female admitted to the hospital after giving birth to a baby boy 2 days ago.  While in the hospital she was found to have a puncture wound on the bottom of the right heel that she sustained 3 weeks ago.  She went to the emergency room and was able to remove the foreign body.  Given the recent pandemic and minimize exposure we were consulted in order to remove the foreign body.  Given the depth of it recommend to bring her to the operating room in order to remove the foreign body.  Alternatives, risks, complications were discussed with the patient detail.  No promises or guarantees were given as documented procedure and all questions were answered the best my ability.  Procedure in detail: Patient was both verbally and visually identified by myself, the nursing staff, and the anesthesia staff in the preoperative waiting area.  Nerve block was performed by the anesthesia staff preoperatively.  She was then brought back to the operating room via stretcher and placed on the operating table in supine position.  A well-padded pneumatic left leg  was placed.  Of note the tourniquet was not inflated during the procedure.  The right lower extremity was then scrubbed, prepped, draped in normal sterile fashion.  On the area of the puncture wound of the plantar aspect the right heel to some elliptical incisions were planned along the area of puncture wound.  15 blade scalpel was utilized to excise a small wedge of tissue of the puncture wound.  As I excised the skin the smaller metallic foreign body was removed within that wedge of tissue.  Dissection was then carried down bluntly and it was easily identified the remaining foreign body.  This was removed in total.  Fluoroscopy was confirmed at both foreign bodies were removed in total.  There is found to be some necrotic tissue present along the area of the foreign body which was debrided.  There is no signs of an abscess.  The puncture did go close to the calcaneus but the bone was not visible and there was soft tissue overlying the calcaneus.  Incision was copiously irrigated and the skin was then closed with Prolene in a simple fashion.  Adaptic was applied followed by a dry sterile dressing.  Feasible, no procedure found to tolerate procedure well.  The conclusion of the procedure the extremity limiting ability.  All digits.  She was transferred to PACU vital signs stable and vascular status intact.  Total 5 cc of lidocaine with epinephrine was infiltrated on the surgical site for hemostasis during the procedure

## 2018-08-16 NOTE — Progress Notes (Signed)
Orthopedic Tech Progress Note Patient Details:  Cassandra Stone 1995-07-04 [id number]  Ortho Devices Type of Ortho Device: CAM walker Ortho Device/Splint Location: Right Foot  Ortho Device/Splint Interventions: Ordered, Application, Adjustment   Post Interventions Patient Tolerated: Well Instructions Provided: Adjustment of device, Care of device   Monish Haliburton J Suellen Durocher 08/16/2018, 12:52 PM

## 2018-08-16 NOTE — Transfer of Care (Signed)
Immediate Anesthesia Transfer of Care Note  Patient: Toshiko Masoud Hamad Throgmorton  Procedure(s) Performed: REMOVAL FOREIGN BODY EXTREMITY (Right )  Patient Location: PACU  Anesthesia Type:MAC and Regional  Level of Consciousness: awake and alert   Airway & Oxygen Therapy: Patient Spontanous Breathing and Patient connected to nasal cannula oxygen  Post-op Assessment: Report given to RN and Post -op Vital signs reviewed and stable  Post vital signs: Reviewed and stable  Last Vitals:  Vitals Value Taken Time  BP 136/91 08/16/2018 11:49 AM  Temp    Pulse 96 08/16/2018 11:50 AM  Resp 19 08/16/2018 11:50 AM  SpO2 100 % 08/16/2018 11:50 AM  Vitals shown include unvalidated device data.  Last Pain:  Vitals:   08/16/18 0950  TempSrc:   PainSc: 4       Patients Stated Pain Goal: 2 (22/02/54 2706)  Complications: No apparent anesthesia complications

## 2018-08-16 NOTE — Lactation Note (Addendum)
This note was copied from a baby's chart. Lactation Consultation Note  Patient Name: Cassandra Stone IRSWN'I Date: 08/16/2018 Reason for consult: Follow-up assessment   Baby 54 hours old.  Mother out of room for surgery to remove foreign body out of her foot. FOB asked LC to help change diaper with void & stool. Lactation to follow up later today.   Maternal Data    Feeding Feeding Type: Breast Fed  LATCH Score                   Interventions    Lactation Tools Discussed/Used     Consult Status Consult Status: Follow-up Date: 08/16/18 Follow-up type: In-patient    Dahlia Byes [facility] 08/16/2018, 9:53 AM

## 2018-08-16 NOTE — Lactation Note (Signed)
This note was copied from a baby's chart. Lactation Consultation Note  Patient Name: Cassandra Stone MVHQI'O Date: 08/16/2018 Reason for consult: Follow-up assessment;Term;Primapara;1st time breastfeeding  P1 mother whose infant is now 13 hours old.  Mother requested lactation assistance after she finished pumping.  RN in room providing ice packs for mother's breast when I arrived.  Offered to show father how to syringe feed and he was willing to assist. Mother obtained 30 mls from her right breast and  a couple mls from her left breast with pumping.  Demonstrated how to pull milk up into the syringe, how to initiate sucking on a gloved finger and how to gently begin the feeding using the curved tip syringe.  Baby was able to easily suck 12 mls and burped after.  Father prefilled a second syringe and, with verbal guidance, was able to feed baby another 12 mls of EBM.  Burped well and baby still fussy.  Father fed an additional 6 mls.  Burped baby again and placed him in the bassinet.  Mother falling asleep in bed.  Mother will breast feed with feeding cues at the next feeding session.  RN updated.   Maternal Data Formula Feeding for Exclusion: No Has patient been taught Hand Expression?: Yes Does the patient have breastfeeding experience prior to this delivery?: No  Feeding Feeding Type: Breast Fed  LATCH Score                   Interventions    Lactation Tools Discussed/Used Tools: Pump WIC Program: No Pump Review: Setup, frequency, and cleaning;Milk Storage Initiated by:: Felis Quillin Date initiated:: 08/16/18   Consult Status Consult Status: Follow-up Date: 08/17/18 Follow-up type: In-patient    Ayriana Wix R Sharday Michl 08/16/2018, 4:20 PM

## 2018-08-16 NOTE — Telephone Encounter (Signed)
[facility] - Dolly Rias states Dr. Ardelle Anton performed surgery on pt today and would like to know pt's weight bearing status, she has a cam boot. I told Dolly Rias, pt could weight bear in the cam boot.

## 2018-08-16 NOTE — Progress Notes (Signed)
PPD #2, VAVD, left mediolateral repair, baby boy "Hassain"  S:  Reports feeling okay; had a difficult night last night due to pain - took Motrin, Tylenol and Oxycodone initially with no relief, but then used ice in a glove and an additional dose of Oxycodone which helped; still having some pain with walking and moving. Reports a burning sensation in left lower abdomen yesterday, but it is gone now.   Going for surgery this morning to remove object in right heel - has been NPO overnight, but states no nausea/vomiting when she was eating; will recover in main PACU, then come back to her room.               Tolerating po/ No nausea or vomiting / Denies CP or SOB/ Reports some dizziness yesterday, but it has resolved              Bleeding is light             Up ad lib/ambulatory slowly and with some difficulty due to repair and heel pain / voiding QS  Newborn breast feeding - is going great per mom; baby latched while I was in the room with audible suck and swallow  / Circumcision - completed   O:               VS: BP 112/72 (BP Location: Right Arm)   Pulse 89   Temp 98.5 F (36.9 C) (Oral)   Resp 18   Ht 5\' 2"  (1.575 m)   Wt 67.6 kg   SpO2 100%   Breastfeeding Unknown   BMI 27.25 kg/m  08/16/18 0900  98.5 F (36.9 C)  89  -  18  112/72  High-fowlers  -  -  - PG   08/16/18 0525  98.3 F (36.8 C)  114Abnormal   -  16  114/58Abnormal   Lying  100 %  -  - BA   08/15/18 2241  98.4 F (36.9 C)  106Abnormal   -  16  133/88  Sitting  100 %  -  - RN   08/15/18 1442  98.1 F (36.7 C)  80  -  17  145/65Abnormal   Semi-fowlers  -  -  - AL   08/15/18 0600  98.1 F (36.7 C)  91  -  16  125/57Abnormal   -  99 %  -         LABS:              Recent Labs    08/13/18 1530 08/14/18 0738  WBC 10.8* 14.6*  HGB 11.4* 9.1*  PLT 318 259               Blood type: --/--/O POS, O POS (04/19 1530)  Rubella: Immune (10/01 0000)                                Physical Exam:             Alert and  oriented X3  Lungs: Clear and unlabored  Heart: regular rate and rhythm / no murmurs  Abdomen: soft, non-tender, non-distended              Fundus: firm, non-tender, U-2  Perineum: well approximated left mediolateral repair, soft on palpation, no evidence of hematoma, no erythema or ecchymosis; no evidence of a hematoma  Lochia: small, no clots   Extremities:  trace upper and lower extremity edema, no calf pain or tenderness    A: PPD # 2, VAVD  Left mediolateral episiotomy repair; hx of pain intolerance with pelvic exams  ABL Anemia - stable on oral FE  Elevated BP on admission - BPs stable, labs WNL  Doing well - stable status  P: Routine post partum orders  Pain control is better today; plan to continue medication regimen and alternating ice pack and warm water sitz baths when she returns from surgery  Per podiatry, plan to keep inpatient one more night for recovery   Continue working with lactation   Anticipate discharge home tomorrow   Carlean JewsMeredith Nicholus Chandran, MSN, CNM [facility]

## 2018-08-16 NOTE — Brief Op Note (Signed)
08/16/2018  11:54 AM  PATIENT:  Cassandra Stone  24 y.o. female  PRE-OPERATIVE DIAGNOSIS:  Foreign body right foot  POST-OPERATIVE DIAGNOSIS:  * No post-op diagnosis entered *  PROCEDURE:  Procedure(s) with comments: REMOVAL FOREIGN BODY EXTREMITY (Right) - OK for anesthesia to do block  SURGEON:  Surgeon(s) and Role:    * Vivi Barrack, DPM - Primary  PHYSICIAN ASSISTANT:   ASSISTANTS: none   ANESTHESIA:   regional  EBL:  10 mL   BLOOD ADMINISTERED:none  DRAINS: none   LOCAL MEDICATIONS USED:  OTHER 5 cc 1% lidocaine with ep  SPECIMEN:  No Specimen  DISPOSITION OF SPECIMEN:  N/A  COUNTS:  YES  TOURNIQUET:  * No tourniquets in log *  DICTATION: .Dragon Dictation  PLAN OF CARE: inpatient  PATIENT DISPOSITION:  PACU - hemodynamically stable.   Delay start of Pharmacological VTE agent (>24hrs) due to surgical blood loss or risk of bleeding: no  Findings: 2 foreign bodies removed. Patient requested pictures. Will email to her husband at her permission. CAM boot ordered. Ice/elevation. Will follow up with her in 1 week. Discharge when pain controlled, likely Thursday.

## 2018-08-17 ENCOUNTER — Encounter (HOSPITAL_COMMUNITY): Payer: Self-pay | Admitting: Podiatry

## 2018-08-17 MED ORDER — IBUPROFEN 600 MG PO TABS: 600.0000 mg | ORAL_TABLET | Freq: Four times a day (QID) | ORAL | 0 refills | Status: AC

## 2018-08-17 NOTE — Progress Notes (Signed)
Minimal lochia, pain controlled with tylenol/ibuprofen Nursing; no difficulty with voiding, ambulating  Temp:  [97 F (36.1 C)-99.9 F (37.7 C)] 99.9 F (37.7 C) (04/23 0520) Pulse Rate:  [93-110] 100 (04/23 0520) Resp:  [12-20] 18 (04/23 0520) BP: (125-137)/(72-91) 128/78 (04/23 0520) SpO2:  [100 %] 100 % (04/23 0520)   A&ox3 rrr ctab Abd: soft, nt, nd; fundus firm LE: no edema, nt bilat LE  CBC Latest Ref Rng & Units 08/14/2018 08/13/2018  WBC 4.0 - 10.5 K/uL 14.6(H) 10.8(H)  Hemoglobin 12.0 - 15.0 g/dL 2.3(F) 11.4(L)  Hematocrit 36.0 - 46.0 % 28.3(L) 36.5  Platelets 150 - 400 K/uL 259 318   A/P: ppd 3 s/p vavd, pod 1 s/p excision of foreign body (rt foot) 1. Doing well - plan d/c home today with office f/u 2. S/p foreign body excision - f/u per podiatry 3. Mild acute anemia - iron q day; asymptomatic 4. Rubella immune 5. Rh pos

## 2018-08-17 NOTE — Anesthesia Postprocedure Evaluation (Signed)
Anesthesia Post Note  Patient: Scientist, research (physical sciences)  Procedure(s) Performed: REMOVAL FOREIGN BODY EXTREMITY (Right )     Patient location during evaluation: PACU Anesthesia Type: MAC and Regional Level of consciousness: awake and alert Pain management: pain level controlled Vital Signs Assessment: post-procedure vital signs reviewed and stable Respiratory status: spontaneous breathing, nonlabored ventilation, respiratory function stable and patient connected to nasal cannula oxygen Cardiovascular status: stable and blood pressure returned to baseline Postop Assessment: no apparent nausea or vomiting Anesthetic complications: no    Last Vitals:  Vitals:   08/16/18 2208 08/17/18 0520  BP: 125/76 128/78  Pulse: 93 100  Resp: 18 18  Temp: 37.2 C 37.7 C  SpO2: 100% 100%    Last Pain:  Vitals:   08/17/18 1143  TempSrc:   PainSc: 3                  Nautia Lem

## 2018-08-17 NOTE — Discharge Summary (Signed)
Obstetric Discharge Summary Reason for Admission: induction of labor Prenatal Procedures: none Intrapartum Procedures: vacuum Postpartum Procedures: none and foreign body excision rt foot Complications-Operative and Postpartum:mediolateral episiotomy Hemoglobin  Date Value Ref Range Status  08/14/2018 9.1 (L) 12.0 - 15.0 g/dL Final    Comment:    REPEATED TO VERIFY   HCT  Date Value Ref Range Status  08/14/2018 28.3 (L) 36.0 - 46.0 % Final    Physical Exam:  General: alert, cooperative and no distress Lochia: appropriate Uterine Fundus: firm Incision: n/a DVT Evaluation: No evidence of DVT seen on physical exam.  Discharge Diagnoses: Term Pregnancy-delivered  Discharge Information: Date: 08/17/2018 Activity: pelvic rest Diet: routine Medications: Ibuprofen and pnv with iron Condition: stable Instructions: refer to practice specific booklet Discharge to: home Follow-up Information    Shea Evans, MD Follow up.   Specialty:  Obstetrics and Gynecology Contact information: [address] [place] [address] [phone]           Newborn Data: Live born female  Birth Weight: 7 lb 0.2 oz (3180 g) APGAR: 8, 10  Newborn Delivery   Birth date/time:  08/14/2018 03:06:00 Delivery type:  Vaginal, Vacuum (Extractor)     Home with mother.  Vick Frees 08/17/2018, 11:50 AM

## 2018-08-17 NOTE — Lactation Note (Signed)
This note was copied from a baby's chart. Lactation Consultation Note: Arrived in room and mother reports that she still needs assistance with latching infant.   Assist mother with placing infant in football hold. Mother taught to latch infant in an off sided latch.  Mothers breast are very full. Infant latched on well . Mother taught to flange infants lips for wide gape. Father brought to bedside to watch so he could aid and assist mother.  Infant sustained latch for 20 mins. Mother taught to do breast compression and also taught parents to listen for swallows.  Infant placed in cross cradle hold. Infant latched on and suckled with audible swallows for 10 mins. Infant then very satified and fell asleep.  Lots of teaching with mother . Advised in treatment and prevention of engorgement.  Mother to continue to cue base feed and feeding infant 8-12 times in 24 hours. Discussed cluster feeding.  Mother and father in graduate programs and are doing classes on line. Discussed pumping with mother. Mother has her own DEBP at home.  Informed mother of available LC services and community support. Recommend that mother phone LC office for breastfeeding questions or concerns.     Patient Name: Cassandra Stone [id number] Date: 08/17/2018 Reason for consult: Follow-up assessment   Maternal Data    Feeding Feeding Type: Breast Fed  LATCH Score                   Interventions    Lactation Tools Discussed/Used     Consult Status      Cassandra Stone 08/17/2018, 11:57 AM

## 2018-08-17 NOTE — Progress Notes (Signed)
Call placed to Dr. Ardelle Anton concerning discharge today. He stated she could go home now and he would call her tomorrow to check on her and make an appointment for removal of stiches.

## 2018-08-18 ENCOUNTER — Other Ambulatory Visit: Payer: Self-pay | Admitting: Obstetrics & Gynecology

## 2018-08-24 ENCOUNTER — Ambulatory Visit (INDEPENDENT_AMBULATORY_CARE_PROVIDER_SITE_OTHER): Payer: PPO | Admitting: Podiatry

## 2018-08-24 ENCOUNTER — Other Ambulatory Visit: Payer: Self-pay

## 2018-08-24 ENCOUNTER — Other Ambulatory Visit: Payer: Self-pay | Admitting: Podiatry

## 2018-08-24 ENCOUNTER — Encounter: Payer: Self-pay | Admitting: Podiatry

## 2018-08-24 ENCOUNTER — Ambulatory Visit (INDEPENDENT_AMBULATORY_CARE_PROVIDER_SITE_OTHER): Payer: PPO

## 2018-08-24 DIAGNOSIS — S90851D Superficial foreign body, right foot, subsequent encounter: Secondary | ICD-10-CM

## 2018-08-24 DIAGNOSIS — M79672 Pain in left foot: Secondary | ICD-10-CM

## 2018-08-24 DIAGNOSIS — S90852D Superficial foreign body, left foot, subsequent encounter: Secondary | ICD-10-CM

## 2018-08-24 NOTE — Progress Notes (Signed)
Subjective: Cassandra Stone is a 23 y.o. is seen today in office s/p right foot foreign body removal preformed on 08/16/2018.  She states that overall her pain is improved since the surgery and her pain is different. She still not able to put a hot present heel.. Denies any systemic complaints such as fevers, chills, nausea, vomiting. No calf pain, chest pain, shortness of breath.   Objective: General: No acute distress, AAOx3  DP/PT pulses palpable 2/4, CRT < 3 sec to all digits.  Protective sensation intact. Motor function intact.  RIGHT foot: Incision is well coapted without any evidence of dehiscence with 3 sutures intact. There is no surrounding erythema, ascending cellulitis, fluctuance, crepitus, malodor, drainage/purulence. There is trace edema around the surgical site. There is mild pain along the surgical site.  There is no erythema or warmth and no clinical signs of infection. No other areas of tenderness to bilateral lower extremities.  No other open lesions or pre-ulcerative lesions.  No pain with calf compression, swelling, warmth, erythema.   Assessment and Plan:  Status post right foot foreign body removal, doing well with no complications   -Treatment options discussed including all alternatives, risks, and complications -X-ray was the 2 areas of metallic foreign body has been removed.  Lateral view there is one small area distal to this that is the most swelling.  No definitive residual foreign body identified. -Small incision any incision is healed.  The sutures were removed today without any complications.  After removal the incision remained well coapted.  Steri-Strips approximately enforcement, antibiotic ointment and a bandage.  She can remove the bandage on Saturday and start to shower as long as the incision is healing well.  Recommend antibiotic ointment dressing changes daily.  Dispensed him today.  Also dispensed offloading pads. -Monitor for any clinical  signs or symptoms of infection and directed to call the office immediately should any occur or go to the ER.  Return in about 2 weeks (around 09/07/2018), or if symptoms worsen or fail to improve.  Vivi Barrack DPM

## 2018-08-24 NOTE — Patient Instructions (Signed)
On Saturday you can remove the bandage and start to shower as long as the incision is healing well and no opening. If you notice any opening please give me a call.   Wash foot with soap and water. Apply antibiotic ointment and a band-aid daily.   If you have any increase in pain, redness, swelling, drainage or any issues please let me know  Stay well!

## 2018-09-07 ENCOUNTER — Other Ambulatory Visit: Payer: Self-pay

## 2018-09-07 ENCOUNTER — Ambulatory Visit (INDEPENDENT_AMBULATORY_CARE_PROVIDER_SITE_OTHER): Payer: PPO | Admitting: Podiatry

## 2018-09-07 ENCOUNTER — Encounter: Payer: Self-pay | Admitting: Podiatry

## 2018-09-07 VITALS — Temp 97.5°F

## 2018-09-07 DIAGNOSIS — S90852D Superficial foreign body, left foot, subsequent encounter: Secondary | ICD-10-CM

## 2018-09-07 DIAGNOSIS — M722 Plantar fascial fibromatosis: Secondary | ICD-10-CM

## 2018-09-07 NOTE — Patient Instructions (Signed)

## 2018-09-08 NOTE — Progress Notes (Signed)
Subjective: Cassandra Stone is a 23 y.o. is seen today in office s/p right foot foreign body removal preformed on 08/16/2018.  She states that overall she is doing somewhat better but she still having some discomfort at the incision site.  She is also been walking differently did not put pressure on the heel and because of this she started developed pain to the arch of her foot.  No recent injury or falls no increase in swelling or redness.  She states the pain that she is experiencing now is different than what she had prior to the surgery.   Denies any systemic complaints such as fevers, chills, nausea, vomiting. No calf pain, chest pain, shortness of breath.   Objective: General: No acute distress, AAOx3  DP/PT pulses palpable 2/4, CRT < 3 sec to all digits.  Protective sensation intact. Motor function intact.  RIGHT foot: Incision is well coapted there is still healing along the incision.  There is no drainage or pus no surrounding erythema.  Minimal edema.  There is no fluctuation or crepitation malodor.  The main area of tenderness she is experiencing is along the medial band plantar fashion the arch of the foot.  Tendon appears to be intact.  Achilles tendon appears to be intact.  No area pinpoint tenderness. No other areas of tenderness to bilateral lower extremities.  No other open lesions or pre-ulcerative lesions.  No pain with calf compression, swelling, warmth, erythema.   Assessment and Plan:  Status post right foot foreign body removal, doing well with no complications   -Treatment options discussed including all alternatives, risks, and complications -Symptoms she is experiencing left plantar fasciitis.  Discussed stretching, ice exercises daily.  Also biceps tendon site.  She has not been doing this.  I dispensed a gel heel cup for her as well as arch support pad.  Also she can use Biofreeze as needed for discomfort.  Continue antibiotic ointment dressing changes on the  incision site daily.  Return in about 3 weeks (around 09/28/2018).  Vivi Barrack DPM

## 2018-10-02 ENCOUNTER — Encounter: Payer: Self-pay | Admitting: Podiatry

## 2018-10-02 ENCOUNTER — Ambulatory Visit (INDEPENDENT_AMBULATORY_CARE_PROVIDER_SITE_OTHER): Payer: PPO | Admitting: Podiatry

## 2018-10-02 ENCOUNTER — Other Ambulatory Visit: Payer: Self-pay

## 2018-10-02 DIAGNOSIS — L989 Disorder of the skin and subcutaneous tissue, unspecified: Secondary | ICD-10-CM

## 2018-10-02 DIAGNOSIS — M722 Plantar fascial fibromatosis: Secondary | ICD-10-CM

## 2018-10-02 DIAGNOSIS — S90852D Superficial foreign body, left foot, subsequent encounter: Secondary | ICD-10-CM

## 2018-10-02 NOTE — Progress Notes (Signed)
Subjective: Timmia [facility] Caslin is a 23 y.o. is seen today in office s/p right foot foreign body removal preformed on 08/16/2018.  Overall she states that her foot is significantly better.  She has been using the Biofreeze which is also helped.  She is back to walk normally.  She is asking if she can start to jog.  She also has a skin lesion to the balls of her feet and is tried trimming them off of the nail corners may, in fact.  Denies any open sores or bleeding.  She states that she has 6 lesions.  The been ongoing for greater than 1 year.  Denies any systemic complaints such as fevers, chills, nausea, vomiting. No calf pain, chest pain, shortness of breath.   Objective: General: No acute distress, AAOx3  DP/PT pulses palpable 2/4, CRT < 3 sec to all digits.  Protective sensation intact. Motor function intact.  RIGHT foot: Incision is well coapted with any evidence of dehiscence and overall incision feeling much better.  There is no pain to the surgical site there is no edema, erythema.  There is no pain the course of the plantar fascia, Achilles tendon.  No area pinpoint tenderness.  The bilateral feet there are multiple punctate annular hyperkeratotic lesions.  Upon debridement this could be more of a small porokeratosis.  No definitive evidence of verruca. No other open lesions or pre-ulcerative lesions.  No pain with calf compression, swelling, warmth, erythema.   Assessment and Plan:  Status post right foot foreign body removal, doing well with no complications; skin lesions  -Treatment options discussed including all alternatives, risks, and complications -From a surgical standpoint she is doing well the incision is well-healed.  I want her to continue moisturizer to the incision.  Continue gentle stretching exercises.  She can start to increase her activity on follow-up.  I will discharge her from postoperative care at this point. -Debrided the hyperkeratotic lesions without any  complications or bleeding.  Ordered a compound cream today for care apothecary for calluses to include salicylic acid as well as urea.  Discussed application instructions, started.  Trula Slade DPM

## 2018-11-20 ENCOUNTER — Telehealth: Payer: Self-pay | Admitting: *Deleted

## 2018-11-20 NOTE — Telephone Encounter (Signed)
It is the callus cream from [employer]. Just a thin layer on each of the skin lesions. If she notices any moisture, skin irritation or any other issue to give it a break for a few days and reapply.

## 2018-11-20 NOTE — Telephone Encounter (Signed)
I spoke with pt and she asked how to use the thick cream she just received from a month ago, and if she needed to rub in and for how long to leave on.

## 2018-11-20 NOTE — Telephone Encounter (Signed)
Pt called and had a question about her medication.

## 2018-11-21 NOTE — Telephone Encounter (Signed)
Left message informing pt of Dr. Leigh Aurora instructions and to call with concerns.

## 2019-01-12 ENCOUNTER — Ambulatory Visit: Payer: PPO | Admitting: Podiatry

## 2019-01-17 ENCOUNTER — Other Ambulatory Visit: Payer: Self-pay

## 2019-01-17 ENCOUNTER — Ambulatory Visit (INDEPENDENT_AMBULATORY_CARE_PROVIDER_SITE_OTHER): Payer: PPO | Admitting: Podiatry

## 2019-01-17 DIAGNOSIS — L989 Disorder of the skin and subcutaneous tissue, unspecified: Secondary | ICD-10-CM | POA: Diagnosis not present

## 2019-01-17 DIAGNOSIS — Q828 Other specified congenital malformations of skin: Secondary | ICD-10-CM | POA: Diagnosis not present

## 2019-01-17 NOTE — Progress Notes (Signed)
  Subjective:  Patient ID: [facility], female    DOB: 07/27/1995,  MRN: [id number]  Chief Complaint  Patient presents with  . Callouses    pt is here for a callous trim, pt states that she never got a cream that Dr. Jacqualyn Posey prescribed    23 y.o. female presents with the above complaint. Hx confirmed with patient. Patients presents with multiple bilateral callus formation on the plantar aspect of the feet. she states that it started hurting 2 weeks ago prior.  Dr. Jacqualyn Posey who prescribed her cream that she never actually received or was lost in the mail patient has not been doing stretching exercises.  Patient states her fifth toe is also throbbing and painful  Review of Systems: Negative except as noted in the HPI. Denies N/V/F/Ch.  Past Medical History:  Diagnosis Date  . GERD (gastroesophageal reflux disease)   . H. pylori infection    2014, treated    Current Outpatient Medications:  .  famotidine (PEPCID) 20 MG tablet, Take 20 mg by mouth daily., Disp: , Rfl:  .  fluconazole (DIFLUCAN) 150 MG tablet, TAKE 1 TABLET BY MOUTH AS DIRECTED ON DAYS 1 AND 7, Disp: , Rfl:  .  ibuprofen (ADVIL) 600 MG tablet, Take 1 tablet (600 mg total) by mouth every 6 (six) hours., Disp: 30 tablet, Rfl: 0 .  nitrofurantoin, macrocrystal-monohydrate, (MACROBID) 100 MG capsule, Take 100 mg by mouth 2 (two) times daily., Disp: , Rfl:  .  NON FORMULARY, [facility]  CALLUSES/CORN-#14, Disp: , Rfl:  .  Prenat-FeChel-FA-DHA w/o Vit A (VITATRUE) 30-1.4 & 300 MG MISC, Take 1 tablet by mouth daily., Disp: , Rfl:  .  Vitamin D, Ergocalciferol, (DRISDOL) 1.25 MG (50000 UT) CAPS capsule, TAKE 1 CAPSULE BY MOUTH ONCE WEEKLY FOR 4 MONTHS (LAB WORK NEEDED AFTER), Disp: , Rfl:   Social History   Tobacco Use  Smoking Status Never Smoker  Smokeless Tobacco Never Used    No Known Allergies Objective:  There were no vitals filed for this visit. There is no height or weight on file to  calculate BMI. Constitutional no acute distress, alert/oriented x3 and appropriate mood and affect  Vascular Dorsalis pedis pulse: present Posterior tibial pulse: present and  Hair pattern: normal Warmth: no warmth  Neurologic Epicritic sensations intact  Dermatologic  multiple hyperkeratotic lesions were noted across the plantar aspect of both feet.  Pain on palpation noted across all lesions.  Another hyperkeratotic lesion on the lateral aspect of the fifth digit was noted  Orthopedic: Swelling: none Tenderness: mild Deformity: None Examination of the feet reveals warm, good capillary refill and normal DP and PT pulses.    Radiographs: None Assessment:   1. Skin lesion   2. Porokeratosis    Plan:  Patient was evaluated and treated and all questions answered.  -All hyperkeratotic lesions were debrided down to good healthy tissue. -Patient was prescribed specialized cream from [facility] for calluses. -Patient states that she will pick up her cream from the apothecary directly -Patient with follow up as needed  Return if symptoms worsen or fail to improve.

## 2019-02-12 ENCOUNTER — Telehealth: Payer: Self-pay | Admitting: Podiatry

## 2019-02-12 MED ORDER — NONFORMULARY OR COMPOUNDED ITEM: 3 refills | Status: AC

## 2019-02-12 NOTE — Telephone Encounter (Signed)
I informed pt the orders were in the computer, but I did not see that it had been faxed to [facility]. Pt states she would like it to be delivered to her. I told her [facility] would call with coverage and delivery information.

## 2019-02-12 NOTE — Telephone Encounter (Signed)
Patient stated she was suppose to receive a medication( cream)  from Dr. Posey [place] for her callus. She went to pharmacy and no prescription was available.

## 2019-02-12 NOTE — Telephone Encounter (Signed)
Faxed orders for the callous cream to [facility].

## 2019-02-12 NOTE — Addendum Note (Signed)
Addended by: Harriett Sine D on: 02/12/2019 02:21 PM   Modules accepted: Orders
